# Patient Record
Sex: Female | Born: 2000 | Race: Black or African American | Hispanic: No | Marital: Single | Smoking: Never smoker
Health system: Southern US, Community
[De-identification: ages and names within clinical notes are randomized; demographics above are authoritative.]

## PROBLEM LIST (undated history)

## (undated) DIAGNOSIS — J45909 Unspecified asthma, uncomplicated: Secondary | ICD-10-CM

## (undated) HISTORY — PX: NO PAST SURGERIES: SHX2092

---
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
## 2021-01-06 ENCOUNTER — Other Ambulatory Visit (HOSPITAL_COMMUNITY)
Admission: RE | Admit: 2021-01-06 | Discharge: 2021-01-06 | Disposition: A | Discharge status: Home / Self Care | Payer: BLUE CROSS/BLUE SHIELD | Source: Ambulatory Visit | Attending: Obstetrics and Gynecology | Admitting: Obstetrics and Gynecology

## 2021-01-06 ENCOUNTER — Other Ambulatory Visit: Payer: Self-pay

## 2021-01-06 ENCOUNTER — Ambulatory Visit (INDEPENDENT_AMBULATORY_CARE_PROVIDER_SITE_OTHER): Payer: BLUE CROSS/BLUE SHIELD

## 2021-01-06 VITALS — BP 116/75 | HR 75

## 2021-01-06 DIAGNOSIS — N898 Other specified noninflammatory disorders of vagina: Secondary | ICD-10-CM

## 2021-01-06 DIAGNOSIS — Z113 Encounter for screening for infections with a predominantly sexual mode of transmission: Secondary | ICD-10-CM | POA: Diagnosis not present

## 2021-01-06 NOTE — Progress Notes (Signed)
SUBJECTIVE:  20 y.o. female complains of vaginal discharge for a couple of days.  Denies abnormal vaginal bleeding or significant pelvic pain or fever. No UTI symptoms. Denies history of known exposure to STD.  No LMP recorded. Patient has had an implant.  OBJECTIVE:  She appears well, afebrile. Urine dipstick: not done  ASSESSMENT:  Vaginal Discharge: small amount  Vaginal Odor; small amount    PLAN:  GC, chlamydia, trichomonas, BVAG, CVAG probe sent to lab. Treatment: To be determined once lab results are received ROV prn if symptoms persist or worsen.

## 2021-01-08 LAB — CERVICOVAGINAL ANCILLARY ONLY
Bacterial Vaginitis (gardnerella): NEGATIVE
Candida Glabrata: NEGATIVE
Candida Vaginitis: NEGATIVE
Chlamydia: NEGATIVE
Comment: NEGATIVE
Comment: NEGATIVE
Comment: NEGATIVE
Comment: NEGATIVE
Comment: NEGATIVE
Comment: NORMAL
Neisseria Gonorrhea: NEGATIVE
Trichomonas: NEGATIVE

## 2021-01-12 ENCOUNTER — Ambulatory Visit
Admission: EM | Admit: 2021-01-12 | Discharge: 2021-01-12 | Disposition: A | Discharge status: Home / Self Care | Payer: BLUE CROSS/BLUE SHIELD | Attending: Emergency Medicine | Admitting: Emergency Medicine

## 2021-01-12 ENCOUNTER — Other Ambulatory Visit: Payer: Self-pay

## 2021-01-12 DIAGNOSIS — G43009 Migraine without aura, not intractable, without status migrainosus: Secondary | ICD-10-CM | POA: Diagnosis not present

## 2021-01-12 DIAGNOSIS — G43001 Migraine without aura, not intractable, with status migrainosus: Secondary | ICD-10-CM

## 2021-01-12 MED ORDER — RIZATRIPTAN BENZOATE 5 MG PO TABS: 5.0000 mg | ORAL_TABLET | ORAL | 0 refills | Status: DC | PRN

## 2021-01-12 NOTE — ED Triage Notes (Signed)
Pt c/o migraines x2 days, hx of same. Denies n/v.

## 2021-01-12 NOTE — ED Provider Notes (Signed)
[name]    CSN: [id number] Arrival date & time: 01/12/21  1406      History   Chief Complaint Chief Complaint  Patient presents with  . Migraine    HPI Sharon Mcpherson is a 20 y.o. female history of asthma presenting today for evaluation of a migraine.  Reports history of recurrent migraines.  Current migraine onset 2 days ago.  Reported location in frontal area, history of similar.  Denies any vision changes, some slight light sensitivity.  Denies nausea or vomiting.  Pain slightly eased off from yesterday.  Reports past visit was prescribed Maxalt and helped significantly.  Requesting refill.  Provided Toradol and Decadron at last visit as well, but patient would like to hold off on this and mainly stick to oral medicine.  Denies recent URI symptoms.  Denies fever or neck pain.  HPI  Past Medical History:  Diagnosis Date  . Asthma     Patient Active Problem List   Diagnosis Date Noted  . Migraine without aura and without status migrainosus, not intractable 11/21/2018  . Tension headache 11/21/2018  . Anxiety state 11/21/2018  . Sleeping difficulty 11/21/2018    Past Surgical History:  Procedure Laterality Date  . NO PAST SURGERIES      OB History    Gravida  0   Para  0   Term  0   Preterm  0   AB  0   Living  0     SAB  0   IAB  0   Ectopic  0   Multiple  0   Live Births  0            Home Medications    Prior to Admission medications   Medication Sig Start Date End Date Taking? Authorizing Provider  etonogestrel (NEXPLANON) 68 MG IMPL implant Inject into the skin.    [provider]  rizatriptan (MAXALT) 5 MG tablet Take 1 tablet (5 mg total) by mouth as needed for migraine. May repeat in 2 hours if needed 01/12/21   Ka Bench, [employer] C, PA-C  amitriptyline (ELAVIL) 25 MG tablet Take 1 tablet (25 mg total) by mouth at bedtime. Patient not taking: Reported on 05/24/2019 11/21/18 06/13/19  Keturah Shavers, MD    Family  History Family History  Problem Relation Age of Onset  . Hyperlipidemia Mother   . Healthy Father   . Migraines Neg Hx   . Seizures Neg Hx   . Autism Neg Hx   . ADD / ADHD Neg Hx   . Anxiety disorder Neg Hx   . Depression Neg Hx   . Bipolar disorder Neg Hx   . Schizophrenia Neg Hx     Social History Social History   Tobacco Use  . Smoking status: Never Smoker  . Smokeless tobacco: Never Used  Vaping Use  . Vaping Use: Never used  Substance Use Topics  . Alcohol use: No  . Drug use: Yes    Types: Marijuana     Allergies   Patient has no known allergies.   Review of Systems Review of Systems  Constitutional: Negative for activity change, appetite change, chills, fatigue and fever.  HENT: Negative for congestion, ear pain, rhinorrhea, sinus pressure, sore throat and trouble swallowing.   Eyes: Negative for discharge and redness.  Respiratory: Negative for cough, chest tightness and shortness of breath.   Cardiovascular: Negative for chest pain.  Gastrointestinal: Negative for abdominal pain, diarrhea, nausea and vomiting.  Musculoskeletal: Negative for myalgias.  Skin: Negative for rash.  Neurological: Positive for headaches. Negative for dizziness and light-headedness.     Physical Exam Triage Vital Signs ED Triage Vitals  Enc Vitals Group     BP      Pulse      Resp      Temp      Temp src      SpO2      Weight      Height      Head Circumference      Peak Flow      Pain Score      Pain Loc      Pain Edu?      Excl. in GC?    No data found.  Updated Vital Signs BP 135/83 (BP Location: Right Arm)   Pulse 69   Temp 98.2 F (36.8 C) (Oral)   Resp 16   SpO2 97%   Visual Acuity Right Eye Distance:   Left Eye Distance:   Bilateral Distance:    Right Eye Near:   Left Eye Near:    Bilateral Near:     Physical Exam Vitals and nursing note reviewed.  Constitutional:      Appearance: She is well-developed.     Comments: No acute distress   HENT:     Head: Normocephalic and atraumatic.     Ears:     Comments: Bilateral ears without tenderness to palpation of external auricle, tragus and mastoid, EAC's without erythema or swelling, TM's with good bony landmarks and cone of light. Non erythematous.     Nose: Nose normal.     Mouth/Throat:     Comments: Oral mucosa pink and moist, no tonsillar enlargement or exudate. Posterior pharynx patent and nonerythematous, no uvula deviation or swelling. Normal phonation. Eyes:     Conjunctiva/sclera: Conjunctivae normal.  Cardiovascular:     Rate and Rhythm: Normal rate.  Pulmonary:     Effort: Pulmonary effort is normal. No respiratory distress.     Comments: Breathing comfortably at rest, CTABL, no wheezing, rales or other adventitious sounds auscultated Abdominal:     General: There is no distension.  Musculoskeletal:        General: Normal range of motion.     Cervical back: Neck supple.  Skin:    General: Skin is warm and dry.  Neurological:     General: No focal deficit present.     Mental Status: She is alert and oriented to person, place, and time. Mental status is at baseline.     Cranial Nerves: No cranial nerve deficit.     Motor: No weakness.     Gait: Gait normal.      UC Treatments / Results  Labs (all labs ordered are listed, but only abnormal results are displayed) Labs Reviewed - No data to display  EKG   Radiology No results found.  Procedures Procedures (including critical care time)  Medications Ordered in UC Medications - No data to display  Initial Impression / Assessment and Plan / UC Course  I have reviewed the triage vital signs and the nursing notes.  Pertinent labs & imaging results that were available during my care of the patient were reviewed by me and considered in my medical decision making (see chart for details).     Refilled Maxalt, encourage patient to contact PCP if this has been helping for further refills and treatment  of recurrent migraines.  No neurodeficit on exam today.  Deferring migraine cocktail.  Discussed strict return precautions. Patient verbalized understanding and is agreeable with plan.  Final Clinical Impressions(s) / UC Diagnoses   Final diagnoses:  Migraine without aura and without status migrainosus, not intractable     Discharge Instructions     Maxalt refilled Follow up with primary care Return as needed    ED Prescriptions    Medication Sig Dispense Auth. Provider   rizatriptan (MAXALT) 5 MG tablet Take 1 tablet (5 mg total) by mouth as needed for migraine. May repeat in 2 hours if needed 10 tablet Raelle Chambers, [place] C, PA-C     PDMP not reviewed this encounter.   Lew Dawes, [place] 01/12/21 1604

## 2021-01-12 NOTE — Discharge Instructions (Signed)
Maxalt refilled Follow up with primary care Return as needed

## 2021-03-08 ENCOUNTER — Other Ambulatory Visit: Payer: Self-pay

## 2021-03-08 ENCOUNTER — Ambulatory Visit (HOSPITAL_COMMUNITY)
Admission: EM | Admit: 2021-03-08 | Discharge: 2021-03-08 | Disposition: A | Discharge status: Home / Self Care | Payer: No Payment, Other | Attending: Psychiatry | Admitting: Psychiatry

## 2021-03-08 DIAGNOSIS — F331 Major depressive disorder, recurrent, moderate: Secondary | ICD-10-CM | POA: Insufficient documentation

## 2021-03-08 MED ORDER — HYDROXYZINE PAMOATE 25 MG PO CAPS: 25.0000 mg | ORAL_CAPSULE | Freq: Three times a day (TID) | ORAL | 0 refills | Status: DC | PRN

## 2021-03-08 MED ORDER — SERTRALINE HCL 50 MG PO TABS: 50.0000 mg | ORAL_TABLET | Freq: Every day | ORAL | 0 refills | Status: DC

## 2021-03-08 NOTE — BH Assessment (Signed)
Triage Note- ROUTINE- Depression sx, "not feeling myself for past few months". Pt denies SI, HI and AVH. Has tried 2 antidepressants with her PCP but didn't find they made any difference so stopped taking them. Pt states she is interested in therapy and trying medication for depression again.

## 2021-03-08 NOTE — Progress Notes (Signed)
Sharon Mcpherson received her AVS, questions answered and escorted to retrieve her personal belkongings.

## 2021-03-08 NOTE — ED Provider Notes (Signed)
[facility] Medical Screening Exam  Patient Name: Sharon Mcpherson MRN: [id number] Date of Evaluation: 03/08/21 Chief Complaint:   Diagnosis:  Final diagnoses:  MDD (major depressive disorder), recurrent episode, moderate (HCC)    History of Present illness: Sharon Mcpherson is a 20 y.o. female patient presented to [facility] as a walk in alone with complaints of "I am not feeling quite my self".  Sharon Mcpherson, 20 y.o., female patient seen face to face by this provider, consulted with Dr. Lucianne Muss; and chart reviewed on 03/08/21.    During evaluation Sharon Mcpherson is sitting position in no acute distress.  She is well-groomed and makes good eye contact.  She is alert, oriented x 4, cooperative and attentive.  Her mood is depressed with congruent affect.  Reports that she has been having increased crying spells and little motivation.  States she sleeps 4 to 5 hours per night, and no concerns with appetite.  She has normal speech, and behavior. Reports she is anxious at times. Objectively there is no evidence of psychosis/mania or delusional thinking.  Patient is able to converse coherently, goal directed thoughts, no distractibility, or pre-occupation.  She also denies suicidal/self-harm/homicidal ideation, psychosis, and paranoia.  Patient contracts for safety, denies any access to weapons or firearms.  Patient answered question appropriately.     Patient states that she sees Dr. Delbert Harness, PCP with [facility] health in [place].  States her visits are mainly for her migraines.  States her MD started her on Cymbalta and Effexor in the past to try to help with depression and her migraines.  Reports that she took both medicines for over a month but did not feel like they were effective so she quit taking them.  Patient states that she also has participated in psychiatric therapy in the past.  States she is willing to engage in therapeutic services again.  Patient is requesting medication  for depression and anxiety.  Prescription for Zoloft 50 mg p.o. daily and hydroxyzine 25 mg p.o. TID PRN given to patient.  Education provided on side effects of medications.  Discussed extensively that refills would need to be obtained through her outpatient provider, there will be no refills provided at the [facility].  Psychiatric Specialty Exam  Presentation  General Appearance:Appropriate for Environment; Casual  Eye Contact:Good  Speech:Clear and Coherent; Normal Rate  Speech Volume:Normal  Handedness:Right   Mood and Affect  Mood:Depressed; Anxious  Affect:Congruent   Thought Process  Thought Processes:Coherent  Descriptions of Associations:Intact  Orientation:Full (Time, Place and Person)  Thought Content:Logical    Hallucinations:None  Ideas of Reference:None  Suicidal Thoughts:No  Homicidal Thoughts:No   Sensorium  Memory:Immediate Good; Recent Good; Remote Good  Judgment:Good  Insight:Good   Executive Functions  Concentration:Good  Attention Span:Good  Recall:Good  Fund of Knowledge:Good  Language:Good   Psychomotor Activity  Psychomotor Activity:Normal   Assets  Assets:Communication Skills; Desire for Improvement; Financial Resources/Insurance; Housing; Physical Health; Resilience; Social Support; Transportation; Vocational/Educational   Sleep  Sleep:Fair  Number of hours: 5   No data recorded  Physical Exam: Physical Exam Vitals and nursing note reviewed.  Constitutional:      General: She is not in acute distress.    Appearance: Normal appearance. She is not ill-appearing.  HENT:     Head: Normocephalic.  Eyes:     Pupils: Pupils are equal, round, and reactive to light.  Cardiovascular:     Rate and Rhythm: Normal rate.  Pulmonary:  Effort: Pulmonary effort is normal.  Musculoskeletal:        General: Normal range of motion.     Cervical back: Normal range of motion.  Skin:    General: Skin is warm and dry.   Neurological:     Mental Status: She is alert and oriented to person, place, and time.  Psychiatric:        Attention and Perception: Attention and perception normal.        Mood and Affect: Affect normal. Mood is anxious and depressed.        Speech: Speech normal.        Behavior: Behavior normal.        Thought Content: Thought content normal.        Cognition and Memory: Cognition normal.        Judgment: Judgment normal.   Review of Systems  Constitutional: Negative.   HENT: Negative.    Eyes: Negative.   Respiratory: Negative.    Cardiovascular: Negative.   Musculoskeletal: Negative.   Skin: Negative.   Neurological:  Positive for headaches.  Psychiatric/Behavioral:  Positive for depression. The patient is nervous/anxious.   Blood pressure 135/87, pulse 99, temperature 98.6 F (37 C), temperature source Oral, resp. rate 16, SpO2 99 %. There is no height or weight on file to calculate BMI.  Musculoskeletal: Strength & Muscle Tone: within normal limits Gait & Station: normal Patient leans: N/A   [facility] MSE Discharge Disposition for Follow up and Recommendations: Based on my evaluation the patient does not appear to have an emergency medical condition and can be discharged with resources and follow up care in outpatient services for Medication Management and Individual Therapy   Prescription for Zoloft 50 mg PO QD and hydroxyzine 25 mg p.o. TID PRN  given to patient. Outpatient psychiatric resources provided.   Ardis Hughs, NP 03/08/2021, 7:59 AM

## 2021-03-08 NOTE — Discharge Instructions (Addendum)

## 2021-03-11 ENCOUNTER — Telehealth (HOSPITAL_COMMUNITY): Payer: Self-pay | Admitting: Family Medicine

## 2021-03-11 NOTE — BH Assessment (Signed)
Care Management - Follow Up [facility] Discharges   Writer attempted to make contact with patient today and was unsuccessful.  Writer was able to leave a HIPPA compliant voice message and will await callback.   

## 2021-06-24 DIAGNOSIS — Z309 Encounter for contraceptive management, unspecified: Secondary | ICD-10-CM | POA: Insufficient documentation

## 2021-12-03 ENCOUNTER — Other Ambulatory Visit (HOSPITAL_COMMUNITY)
Admission: RE | Admit: 2021-12-03 | Discharge: 2021-12-03 | Disposition: A | Discharge status: Home / Self Care | Payer: Medicaid Other | Source: Ambulatory Visit | Attending: Obstetrics and Gynecology | Admitting: Obstetrics and Gynecology

## 2021-12-03 ENCOUNTER — Ambulatory Visit (INDEPENDENT_AMBULATORY_CARE_PROVIDER_SITE_OTHER): Payer: Medicaid Other

## 2021-12-03 DIAGNOSIS — R350 Frequency of micturition: Secondary | ICD-10-CM | POA: Diagnosis not present

## 2021-12-03 DIAGNOSIS — N898 Other specified noninflammatory disorders of vagina: Secondary | ICD-10-CM

## 2021-12-03 LAB — POCT URINALYSIS DIPSTICK
Bilirubin, UA: NEGATIVE
Blood, UA: NEGATIVE
Glucose, UA: NEGATIVE
Ketones, UA: NEGATIVE
Leukocytes, UA: NEGATIVE
Nitrite, UA: NEGATIVE
Protein, UA: NEGATIVE
Spec Grav, UA: 1.01 (ref 1.010–1.025)
Urobilinogen, UA: 0.2 E.U./dL
pH, UA: 6.5 (ref 5.0–8.0)

## 2021-12-03 NOTE — Progress Notes (Signed)
..  SUBJECTIVE:  ?21 y.o. female complains of  vaginal discharge and irritation for 2 day(s). ?Denies abnormal vaginal bleeding or significant pelvic pain or ?fever. Reports urinary frequency. Denies history of known exposure to STD. ? ?No LMP recorded. Patient has had an implant. ? ?OBJECTIVE:  ?She appears well, afebrile. ?Urine dipstick: negative for all components. ? ?ASSESSMENT:  ?Vaginal Discharge  ?Denies Vaginal Odor ? ? ?PLAN:  ?GC, chlamydia, trichomonas, BVAG, CVAG probe sent to lab. ?Treatment: To be determined once lab results are received ?Urine culture sent ?ROV prn if symptoms persist or worsen. ? ? ?

## 2021-12-03 NOTE — Progress Notes (Signed)
Patient was assessed and managed by nursing staff during this encounter. I have reviewed the chart and agree with the documentation and plan. I have also made any necessary editorial changes. ? ?Roneka Gilpin A Bobetta Korf, MD ?12/03/2021 11:45 AM   ?

## 2021-12-04 LAB — CERVICOVAGINAL ANCILLARY ONLY
Bacterial Vaginitis (gardnerella): POSITIVE — AB
Candida Glabrata: NEGATIVE
Candida Vaginitis: NEGATIVE
Chlamydia: NEGATIVE
Comment: NEGATIVE
Comment: NEGATIVE
Comment: NEGATIVE
Comment: NEGATIVE
Comment: NEGATIVE
Comment: NORMAL
Neisseria Gonorrhea: NEGATIVE
Trichomonas: NEGATIVE

## 2021-12-05 LAB — CULTURE, OB URINE

## 2021-12-05 LAB — URINE CULTURE, OB REFLEX

## 2021-12-07 ENCOUNTER — Other Ambulatory Visit: Payer: Self-pay

## 2021-12-07 DIAGNOSIS — N760 Acute vaginitis: Secondary | ICD-10-CM

## 2021-12-07 MED ORDER — METRONIDAZOLE 500 MG PO TABS: 500.0000 mg | ORAL_TABLET | Freq: Two times a day (BID) | ORAL | 0 refills | Status: DC

## 2021-12-28 ENCOUNTER — Ambulatory Visit
Admission: EM | Admit: 2021-12-28 | Discharge: 2021-12-28 | Disposition: A | Discharge status: Home / Self Care | Payer: Medicaid Other | Attending: Urgent Care | Admitting: Urgent Care

## 2021-12-28 DIAGNOSIS — H6982 Other specified disorders of Eustachian tube, left ear: Secondary | ICD-10-CM | POA: Diagnosis not present

## 2021-12-28 MED ORDER — LEVOCETIRIZINE DIHYDROCHLORIDE 5 MG PO TABS: 5.0000 mg | ORAL_TABLET | Freq: Every evening | ORAL | 0 refills | Status: DC

## 2021-12-28 MED ORDER — PSEUDOEPHEDRINE HCL 30 MG PO TABS: 30.0000 mg | ORAL_TABLET | Freq: Three times a day (TID) | ORAL | 0 refills | Status: DC | PRN

## 2021-12-28 MED ORDER — FLUTICASONE PROPIONATE 50 MCG/ACT NA SUSP: 2.0000 | Freq: Every day | NASAL | 12 refills | Status: DC

## 2021-12-28 NOTE — ED Triage Notes (Signed)
Patient presents to Urgent Care with complaints of ear ringing since this morning in her L ear.  ? ?

## 2021-12-28 NOTE — ED Provider Notes (Signed)
?[facility] ? ? ?MRN: [id number] DOB: 03-19-2001 ? ?Subjective:  ? ?Sharon Mcpherson is a 21 y.o. female presenting for 2 to 3-day history of acute onset intermittent left ear ringing.  No fever, drainage, dizziness, runny or stuffy nose, sore throat, cough. ? ?No current facility-administered medications for this encounter. ? ?Current Outpatient Medications:  ?  etonogestrel (NEXPLANON) 68 MG IMPL implant, Inject into the skin., Disp: , Rfl:  ?  hydrOXYzine (VISTARIL) 25 MG capsule, Take 1 capsule (25 mg total) by mouth 3 (three) times daily as needed for anxiety. (Patient not taking: Reported on 12/03/2021), Disp: 30 capsule, Rfl: 0 ?  metroNIDAZOLE (FLAGYL) 500 MG tablet, Take 1 tablet (500 mg total) by mouth 2 (two) times daily., Disp: 14 tablet, Rfl: 0 ?  rizatriptan (MAXALT) 5 MG tablet, Take 1 tablet (5 mg total) by mouth as needed for migraine. May repeat in 2 hours if needed (Patient not taking: Reported on 12/03/2021), Disp: 10 tablet, Rfl: 0 ?  sertraline (ZOLOFT) 50 MG tablet, Take 1 tablet (50 mg total) by mouth daily. (Patient not taking: Reported on 12/03/2021), Disp: 30 tablet, Rfl: 0  ? ?No Known Allergies ? ?Past Medical History:  ?Diagnosis Date  ? Asthma   ?  ? ?Past Surgical History:  ?Procedure Laterality Date  ? NO PAST SURGERIES    ? ? ?Family History  ?Problem Relation Age of Onset  ? Hyperlipidemia Mother   ? Healthy Father   ? Migraines Neg Hx   ? Seizures Neg Hx   ? Autism Neg Hx   ? ADD / ADHD Neg Hx   ? Anxiety disorder Neg Hx   ? Depression Neg Hx   ? Bipolar disorder Neg Hx   ? Schizophrenia Neg Hx   ? ? ?Social History  ? ?Tobacco Use  ? Smoking status: Never  ? Smokeless tobacco: Never  ?Vaping Use  ? Vaping Use: Never used  ?Substance Use Topics  ? Alcohol use: No  ? Drug use: Yes  ?  Frequency: 2.0 times per week  ?  Types: Marijuana  ?  Comment: weekly  ? ? ?ROS ? ? ?Objective:  ? ?Vitals: ?BP 121/82 (BP Location: Right Arm)   Pulse 81   Temp 98 ?F (36.7 ?C) (Oral)    Resp 18   SpO2 96%  ? ?Physical Exam ?Constitutional:   ?   General: She is not in acute distress. ?   Appearance: Normal appearance. She is well-developed. She is not ill-appearing, toxic-appearing or diaphoretic.  ?HENT:  ?   Head: Normocephalic and atraumatic.  ?   Right Ear: Tympanic membrane, ear canal and external ear normal. No tenderness. There is no impacted cerumen. Tympanic membrane is not injected, perforated, erythematous or bulging.  ?   Left Ear: Tympanic membrane, ear canal and external ear normal. No tenderness. There is no impacted cerumen. Tympanic membrane is not injected, perforated, erythematous or bulging.  ?   Nose: Nose normal.  ?   Mouth/Throat:  ?   Mouth: Mucous membranes are moist.  ?Eyes:  ?   General: No scleral icterus.    ?   Right eye: No discharge.     ?   Left eye: No discharge.  ?   Extraocular Movements: Extraocular movements intact.  ?Cardiovascular:  ?   Rate and Rhythm: Normal rate.  ?Pulmonary:  ?   Effort: Pulmonary effort is normal.  ?Skin: ?   General: Skin is warm and dry.  ?Neurological:  ?  General: No focal deficit present.  ?   Mental Status: She is alert and oriented to person, place, and time.  ?Psychiatric:     ?   Mood and Affect: Mood normal.     ?   Behavior: Behavior normal.  ? ? ?Assessment and Plan :  ? ?PDMP not reviewed this encounter. ? ?1. Eustachian tube dysfunction, left   ? ?Unremarkable ENT exam.  Will use conservative management for what I suspect is eustachian tube dysfunction.  Recommended starting Flonase, Zyrtec, Sudafed. Counseled patient on potential for adverse effects with medications prescribed/recommended today, ER and return-to-clinic precautions discussed, patient verbalized understanding. ? ?  ?Wallis Bamberg, PA-C ?12/29/21 1660 ? ?

## 2022-01-12 ENCOUNTER — Ambulatory Visit (INDEPENDENT_AMBULATORY_CARE_PROVIDER_SITE_OTHER): Payer: Medicaid Other

## 2022-01-12 ENCOUNTER — Other Ambulatory Visit (HOSPITAL_COMMUNITY)
Admission: RE | Admit: 2022-01-12 | Discharge: 2022-01-12 | Disposition: A | Discharge status: Home / Self Care | Payer: Medicaid Other | Source: Ambulatory Visit | Attending: Obstetrics and Gynecology | Admitting: Obstetrics and Gynecology

## 2022-01-12 VITALS — BP 122/82 | HR 79 | Ht 60.0 in | Wt 132.0 lb

## 2022-01-12 DIAGNOSIS — N898 Other specified noninflammatory disorders of vagina: Secondary | ICD-10-CM | POA: Diagnosis present

## 2022-01-12 NOTE — Progress Notes (Signed)
SUBJECTIVE:  ?21 y.o. female complains of green and malodorous vaginal discharge and itching for 2 week(s). ?Denies abnormal vaginal bleeding or significant pelvic pain or ?fever. No UTI symptoms. Denies history of known exposure to STD. ? ?No LMP recorded. Patient has had an implant. ? ?OBJECTIVE:  ?She appears well, afebrile. ?Urine dipstick: not done. ? ?ASSESSMENT:  ?Vaginal Discharge  ?Vaginal Odor, Itching ? ? ?PLAN:  ?GC, chlamydia, trichomonas, BVAG, CVAG probe sent to lab. ?Treatment: To be determined once lab results are received ?ROV prn if symptoms persist or worsen.  ?

## 2022-01-12 NOTE — Progress Notes (Signed)
Patient was assessed and managed by nursing staff during this encounter. I have reviewed the chart and agree with the documentation and plan. I have also made any necessary editorial changes. ? ?Kadian Barcellos, MD ?01/12/2022 2:01 PM  ? ?

## 2022-01-13 ENCOUNTER — Encounter: Payer: Self-pay | Admitting: Emergency Medicine

## 2022-01-13 ENCOUNTER — Ambulatory Visit
Admission: EM | Admit: 2022-01-13 | Discharge: 2022-01-13 | Disposition: A | Discharge status: Home / Self Care | Payer: Medicaid Other | Attending: Physician Assistant | Admitting: Physician Assistant

## 2022-01-13 ENCOUNTER — Ambulatory Visit (INDEPENDENT_AMBULATORY_CARE_PROVIDER_SITE_OTHER): Payer: Medicaid Other

## 2022-01-13 DIAGNOSIS — M542 Cervicalgia: Secondary | ICD-10-CM

## 2022-01-13 DIAGNOSIS — M79605 Pain in left leg: Secondary | ICD-10-CM

## 2022-01-13 LAB — CERVICOVAGINAL ANCILLARY ONLY
Bacterial Vaginitis (gardnerella): POSITIVE — AB
Candida Glabrata: NEGATIVE
Candida Vaginitis: NEGATIVE
Chlamydia: NEGATIVE
Comment: NEGATIVE
Comment: NEGATIVE
Comment: NEGATIVE
Comment: NEGATIVE
Comment: NEGATIVE
Comment: NORMAL
Neisseria Gonorrhea: NEGATIVE
Trichomonas: NEGATIVE

## 2022-01-13 MED ORDER — NAPROXEN 375 MG PO TABS: 375.0000 mg | ORAL_TABLET | Freq: Two times a day (BID) | ORAL | 0 refills | Status: DC

## 2022-01-13 MED ORDER — METHOCARBAMOL 500 MG PO TABS: 500.0000 mg | ORAL_TABLET | Freq: Two times a day (BID) | ORAL | 0 refills | Status: DC

## 2022-01-13 NOTE — Discharge Instructions (Signed)
Your x-ray was normal.  I believe that you have a bone bruise which is causing your symptoms.  Start Naprosyn twice daily.  Do not take NSAIDs including aspirin, ibuprofen/Advil, naproxen/Aleve as it will cause stomach bleeding.  You can use Tylenol for breakthrough pain.  Keep your leg elevated and use ice for additional symptom relief.  I have called in a muscle relaxer that should help with your pain.  This will make you sleepy so do not drive or drink alcohol while taking it.  You may benefit from seeing a sports medicine specialist to arrange physical therapy; please call to schedule an appointment..  If anything worsens and you have severe pain you should be seen immediately. ?
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
## 2022-01-14 MED ORDER — METRONIDAZOLE 500 MG PO TABS: 500.0000 mg | ORAL_TABLET | Freq: Two times a day (BID) | ORAL | 0 refills | Status: DC

## 2022-01-14 NOTE — Addendum Note (Signed)
Addended by: Catalina Antigua on: 01/14/2022 09:43 AM ? ? Modules accepted: Orders ? ?

## 2022-03-11 ENCOUNTER — Ambulatory Visit: Payer: Medicaid Other

## 2022-03-15 ENCOUNTER — Other Ambulatory Visit (HOSPITAL_COMMUNITY)
Admission: RE | Admit: 2022-03-15 | Discharge: 2022-03-15 | Disposition: A | Discharge status: Home / Self Care | Payer: Medicaid Other | Source: Ambulatory Visit | Attending: Obstetrics & Gynecology | Admitting: Obstetrics & Gynecology

## 2022-03-15 ENCOUNTER — Ambulatory Visit (INDEPENDENT_AMBULATORY_CARE_PROVIDER_SITE_OTHER): Payer: Medicaid Other

## 2022-03-15 VITALS — BP 125/76 | HR 76 | Ht 60.0 in | Wt 127.0 lb

## 2022-03-15 DIAGNOSIS — N898 Other specified noninflammatory disorders of vagina: Secondary | ICD-10-CM

## 2022-03-15 DIAGNOSIS — R350 Frequency of micturition: Secondary | ICD-10-CM

## 2022-03-15 LAB — POCT URINALYSIS DIPSTICK
Bilirubin, UA: NEGATIVE
Glucose, UA: NEGATIVE
Nitrite, UA: NEGATIVE
Protein, UA: POSITIVE — AB
Spec Grav, UA: 1.02 (ref 1.010–1.025)
Urobilinogen, UA: 0.2 E.U./dL
pH, UA: 5 (ref 5.0–8.0)

## 2022-03-15 NOTE — Progress Notes (Signed)
SUBJECTIVE:  21 y.o. female complains of white vaginal discharge for 5 day(s). Denies abnormal vaginal bleeding or significant pelvic pain or fever. Denies history of known exposure to STD.  No LMP recorded. Patient has had an implant.  OBJECTIVE:  She appears well, afebrile. Urine dipstick: positive for protein, positive for leukocytes, and positive for ketones.  ASSESSMENT:  Vaginal Discharge  Vaginal Odor Urinary Frequency   PLAN:  GC, chlamydia, trichomonas, BVAG, CVAG probe and Urine Culture sent to lab.  Treatment: To be determined once lab results are received ROV prn if symptoms persist or worsen.

## 2022-03-16 LAB — CERVICOVAGINAL ANCILLARY ONLY
Bacterial Vaginitis (gardnerella): NEGATIVE
Candida Glabrata: NEGATIVE
Candida Vaginitis: NEGATIVE
Chlamydia: NEGATIVE
Comment: NEGATIVE
Comment: NEGATIVE
Comment: NEGATIVE
Comment: NEGATIVE
Comment: NEGATIVE
Comment: NORMAL
Neisseria Gonorrhea: NEGATIVE
Trichomonas: NEGATIVE

## 2022-03-17 LAB — URINE CULTURE

## 2022-03-22 ENCOUNTER — Other Ambulatory Visit: Payer: Self-pay | Admitting: *Deleted

## 2022-03-22 DIAGNOSIS — N3000 Acute cystitis without hematuria: Secondary | ICD-10-CM

## 2022-03-22 MED ORDER — AMOXICILLIN-POT CLAVULANATE 875-125 MG PO TABS: 1.0000 | ORAL_TABLET | Freq: Two times a day (BID) | ORAL | 0 refills | Status: DC

## 2022-03-22 NOTE — Progress Notes (Signed)
Positive urine culture. TC from pt requesting RX. Symptomatic. Consulted with Dr. Macon Large. V.O. for Augmentin RX. RX sent per orders. MyChart education sent to patient.

## 2022-03-30 NOTE — Progress Notes (Signed)
Patient was assessed and managed by nursing staff during this encounter. I have reviewed the chart and agree with the documentation and plan. I have also made any necessary editorial changes.  Dusan Lipford, MD 03/30/2022 3:00 PM   

## 2022-04-26 ENCOUNTER — Other Ambulatory Visit (HOSPITAL_COMMUNITY)
Admission: RE | Admit: 2022-04-26 | Discharge: 2022-04-26 | Disposition: A | Discharge status: Home / Self Care | Payer: Medicaid Other | Source: Ambulatory Visit | Attending: Obstetrics and Gynecology | Admitting: Obstetrics and Gynecology

## 2022-04-26 ENCOUNTER — Ambulatory Visit: Payer: Medicaid Other

## 2022-04-26 ENCOUNTER — Ambulatory Visit (INDEPENDENT_AMBULATORY_CARE_PROVIDER_SITE_OTHER): Payer: Medicaid Other

## 2022-04-26 VITALS — BP 126/83 | HR 76 | Ht 60.0 in | Wt 125.8 lb

## 2022-04-26 DIAGNOSIS — R300 Dysuria: Secondary | ICD-10-CM | POA: Diagnosis not present

## 2022-04-26 DIAGNOSIS — N898 Other specified noninflammatory disorders of vagina: Secondary | ICD-10-CM | POA: Diagnosis present

## 2022-04-26 LAB — POCT URINALYSIS DIPSTICK
Bilirubin, UA: POSITIVE
Blood, UA: POSITIVE
Glucose, UA: NEGATIVE
Ketones, UA: NEGATIVE
Nitrite, UA: NEGATIVE
Protein, UA: POSITIVE — AB
Spec Grav, UA: 1.01 (ref 1.010–1.025)
Urobilinogen, UA: 0.2 E.U./dL
pH, UA: 6 (ref 5.0–8.0)

## 2022-04-26 LAB — POCT URINE PREGNANCY: Preg Test, Ur: NEGATIVE

## 2022-04-26 NOTE — Progress Notes (Signed)
SUBJECTIVE: Sharon Mcpherson is a 21 y.o. female who complains of dysuria x 1 week, without flank pain, fever, chills, or abnormal vaginal discharge or bleeding.   OBJECTIVE: Appears well, in no apparent distress.  Vital signs are normal. Urine dipstick shows positive for protein, positive for leukocytes, and positive for urobilinogen.    ASSESSMENT: Dysuria  PLAN: Treatment per orders.  Call or return to clinic prn if these symptoms worsen or fail to improve as anticipated.

## 2022-04-26 NOTE — Progress Notes (Signed)
SUBJECTIVE:  21 y.o. female who desires a STI screen. Having clear vaginal discharge with odor for approx 1 week. Denies history of known exposure to STD.  No LMP recorded. Patient has had an implant.  OBJECTIVE:  She appears well.   ASSESSMENT:  STI Screen  POCT  UPT NEG  PLAN:  Pt offered STI blood screening-not indicated GC, chlamydia, and trichomonas probe sent to lab.  Treatment: To be determined once lab results are received.  Pt follow up as needed.

## 2022-04-27 LAB — CERVICOVAGINAL ANCILLARY ONLY
Bacterial Vaginitis (gardnerella): NEGATIVE
Candida Glabrata: NEGATIVE
Candida Vaginitis: POSITIVE — AB
Chlamydia: NEGATIVE
Comment: NEGATIVE
Comment: NEGATIVE
Comment: NEGATIVE
Comment: NEGATIVE
Comment: NEGATIVE
Comment: NORMAL
Neisseria Gonorrhea: NEGATIVE
Trichomonas: NEGATIVE

## 2022-04-27 MED ORDER — FLUCONAZOLE 150 MG PO TABS: 150.0000 mg | ORAL_TABLET | Freq: Once | ORAL | 0 refills | Status: AC

## 2022-04-27 NOTE — Addendum Note (Signed)
Addended by: Catalina Antigua on: 04/27/2022 01:24 PM   Modules accepted: Orders

## 2022-04-29 LAB — URINE CULTURE

## 2022-05-07 ENCOUNTER — Other Ambulatory Visit: Payer: Self-pay | Admitting: Obstetrics & Gynecology

## 2022-05-07 DIAGNOSIS — N3000 Acute cystitis without hematuria: Secondary | ICD-10-CM

## 2022-06-10 ENCOUNTER — Ambulatory Visit (INDEPENDENT_AMBULATORY_CARE_PROVIDER_SITE_OTHER): Payer: Medicaid Other | Admitting: General Practice

## 2022-06-10 ENCOUNTER — Other Ambulatory Visit (HOSPITAL_COMMUNITY)
Admission: RE | Admit: 2022-06-10 | Discharge: 2022-06-10 | Disposition: A | Discharge status: Home / Self Care | Payer: Medicaid Other | Source: Ambulatory Visit | Attending: Obstetrics and Gynecology | Admitting: Obstetrics and Gynecology

## 2022-06-10 VITALS — BP 134/83 | HR 73 | Ht 60.0 in | Wt 131.4 lb

## 2022-06-10 DIAGNOSIS — N898 Other specified noninflammatory disorders of vagina: Secondary | ICD-10-CM

## 2022-06-10 LAB — POCT URINALYSIS DIPSTICK
Bilirubin, UA: NEGATIVE
Blood, UA: POSITIVE
Glucose, UA: NEGATIVE
Ketones, UA: NEGATIVE
Leukocytes, UA: NEGATIVE
Nitrite, UA: NEGATIVE
Protein, UA: NEGATIVE
Spec Grav, UA: 1.01 (ref 1.010–1.025)
Urobilinogen, UA: 1 E.U./dL
pH, UA: 7 (ref 5.0–8.0)

## 2022-06-10 MED ORDER — NITROFURANTOIN MONOHYD MACRO 100 MG PO CAPS: 100.0000 mg | ORAL_CAPSULE | Freq: Two times a day (BID) | ORAL | 0 refills | Status: DC

## 2022-06-10 NOTE — Progress Notes (Signed)
SUBJECTIVE:  21 y.o. female complains of white vaginal discharge and foul smelling urine for 1 month(s). Denies abnormal vaginal bleeding or significant pelvic pain or fever. No UTI symptoms. Denies history of known exposure to STD.  No LMP recorded. Patient has had an implant.  OBJECTIVE:  She appears well, afebrile. Urine dipstick: positive for RBC's.  ASSESSMENT:  Vaginal Discharge  Vaginal Odor   PLAN:  GC, chlamydia, trichomonas, BVAG, CVAG probe sent to lab. Macrobid sent to pharmacy per protocol. Further to be determined once lab results are received ROV prn if symptoms persist or worsen.

## 2022-06-13 LAB — URINE CULTURE

## 2022-06-14 LAB — CERVICOVAGINAL ANCILLARY ONLY
Bacterial Vaginitis (gardnerella): NEGATIVE
Candida Glabrata: NEGATIVE
Candida Vaginitis: POSITIVE — AB
Chlamydia: NEGATIVE
Comment: NEGATIVE
Comment: NEGATIVE
Comment: NEGATIVE
Comment: NEGATIVE
Comment: NEGATIVE
Comment: NORMAL
Neisseria Gonorrhea: NEGATIVE
Trichomonas: NEGATIVE

## 2022-06-14 MED ORDER — FLUCONAZOLE 150 MG PO TABS: 150.0000 mg | ORAL_TABLET | Freq: Once | ORAL | 0 refills | Status: AC

## 2022-06-14 NOTE — Addendum Note (Signed)
Addended by: Mora Bellman on: 06/14/2022 08:39 AM   Modules accepted: Level of Service

## 2022-06-14 NOTE — Addendum Note (Signed)
Addended by: Mora Bellman on: 06/14/2022 05:02 PM   Modules accepted: Orders

## 2022-06-16 ENCOUNTER — Encounter: Payer: Self-pay | Admitting: Obstetrics

## 2022-06-16 ENCOUNTER — Ambulatory Visit (INDEPENDENT_AMBULATORY_CARE_PROVIDER_SITE_OTHER): Payer: Medicaid Other | Admitting: Obstetrics

## 2022-06-16 VITALS — BP 130/91 | HR 63 | Ht 59.5 in | Wt 129.7 lb

## 2022-06-16 DIAGNOSIS — Z3009 Encounter for other general counseling and advice on contraception: Secondary | ICD-10-CM

## 2022-06-16 DIAGNOSIS — Z3046 Encounter for surveillance of implantable subdermal contraceptive: Secondary | ICD-10-CM

## 2022-06-16 DIAGNOSIS — Z30011 Encounter for initial prescription of contraceptive pills: Secondary | ICD-10-CM

## 2022-06-16 MED ORDER — NORETHIN ACE-ETH ESTRAD-FE 1-20 MG-MCG(24) PO TABS: 1.0000 | ORAL_TABLET | Freq: Every day | ORAL | 11 refills | Status: DC

## 2022-06-16 NOTE — Progress Notes (Signed)
Pt is in the office for nexpanon removal, inserted 06/11/2019. Pt wants to start [facility] pills.

## 2022-06-16 NOTE — Progress Notes (Signed)
[facility] REMOVAL NOTE  Date of LMP:   unknown  Contraception used: *Nexplanon   Indications:  The patient desires removal of Nexplanon because of expiration   She wants to start OCP's. She understands risks, benefits, and alternatives to Implanon and would like to proceed.  Anesthesia:   Lidocaine 1% plain.  Procedure:  A time-out was performed confirming the procedure and the patient's allergy status.  Complications: None                      The rod was palpated and the area was sterilely prepped.  The area beneath the distal tip was anesthetized with 1% xylocaine and the skin incised                       Over the tip and the tip was exposed, grasped with forcep and removed intact.  A single suture of 4-0 Vicryl was used to close incision.  Steri strip                       And a bandage applied and the arm was wrapped with gauze bandage.  The patient tolerated well.  Instructions:  The patient was instructed to remove the dressing in 24 hours and that some bruising is to be expected.  She was advised to use over the counter analgesics as needed for any pain at the site.  She is to keep the area dry for 24 hours and to call if her hand or arm becomes cold, numb, or blue.  Return visit:  Return in 2 weeks   I have spent a total of 20 minutes of face-to-face and non-face-to-face time, excluding clinical staff time, reviewing notes and preparing to see patient, ordering tests and/or medications, and counseling the patient.   Shelly Bombard, MD, [place] Attending [place], [facility] for [facility], [place] , [place] 06-16-22

## 2022-07-07 ENCOUNTER — Ambulatory Visit (INDEPENDENT_AMBULATORY_CARE_PROVIDER_SITE_OTHER): Payer: Medicaid Other | Admitting: Obstetrics

## 2022-07-07 ENCOUNTER — Encounter: Payer: Self-pay | Admitting: Obstetrics

## 2022-07-07 VITALS — BP 128/91 | HR 87 | Ht 59.5 in | Wt 134.0 lb

## 2022-07-07 DIAGNOSIS — N3000 Acute cystitis without hematuria: Secondary | ICD-10-CM

## 2022-07-07 DIAGNOSIS — T8149XA Infection following a procedure, other surgical site, initial encounter: Secondary | ICD-10-CM | POA: Diagnosis not present

## 2022-07-07 MED ORDER — DOXYCYCLINE HYCLATE 100 MG PO CAPS: 100.0000 mg | ORAL_CAPSULE | Freq: Two times a day (BID) | ORAL | 0 refills | Status: DC

## 2022-07-07 MED ORDER — METRONIDAZOLE 500 MG PO TABS: 500.0000 mg | ORAL_TABLET | Freq: Two times a day (BID) | ORAL | 2 refills | Status: DC

## 2022-07-07 NOTE — Progress Notes (Signed)
Patient ID: Sharon Mcpherson, female   DOB: October 01, 2000, 21 y.o.   MRN: [id number]  Chief Complaint  Patient presents with   Follow-up    Nexplanon site check    HPI Sharon Mcpherson is a 21 y.o. female.  Complains of pain at Nexplanon Removal incision.  Denies fever/chills.  She also noted pus from the incision. HPI  Past Medical History:  Diagnosis Date   Asthma     Past Surgical History:  Procedure Laterality Date   NO PAST SURGERIES      Family History  Problem Relation Age of Onset   Hyperlipidemia Mother    Healthy Father    Migraines Neg Hx    Seizures Neg Hx    Autism Neg Hx    ADD / ADHD Neg Hx    Anxiety disorder Neg Hx    Depression Neg Hx    Bipolar disorder Neg Hx    Schizophrenia Neg Hx     Social History Social History   Tobacco Use   Smoking status: Never   Smokeless tobacco: Never  Vaping Use   Vaping Use: Never used  Substance Use Topics   Alcohol use: Yes   Drug use: Yes    Frequency: 2.0 times per week    Types: Marijuana    Comment: weekly    No Known Allergies  Current Outpatient Medications  Medication Sig Dispense Refill   doxycycline (VIBRAMYCIN) 100 MG capsule Take 1 capsule (100 mg total) by mouth 2 (two) times daily. 14 capsule 0   metroNIDAZOLE (FLAGYL) 500 MG tablet Take 1 tablet (500 mg total) by mouth 2 (two) times daily. 14 tablet 2   fluticasone (FLONASE) 50 MCG/ACT nasal spray Place 2 sprays into both nostrils daily. (Patient not taking: Reported on 04/26/2022) 16 g 12   hydrOXYzine (VISTARIL) 25 MG capsule Take 1 capsule (25 mg total) by mouth 3 (three) times daily as needed for anxiety. (Patient not taking: Reported on 12/03/2021) 30 capsule 0   levocetirizine (XYZAL) 5 MG tablet Take 1 tablet (5 mg total) by mouth every evening. (Patient not taking: Reported on 04/26/2022) 90 tablet 0   methocarbamol (ROBAXIN) 500 MG tablet Take 1 tablet (500 mg total) by mouth 2 (two) times daily. (Patient not taking: Reported on 04/26/2022)  20 tablet 0   metroNIDAZOLE (FLAGYL) 500 MG tablet Take 1 tablet (500 mg total) by mouth 2 (two) times daily. (Patient not taking: Reported on 04/26/2022) 14 tablet 0   naproxen (NAPROSYN) 375 MG tablet Take 1 tablet (375 mg total) by mouth 2 (two) times daily. (Patient not taking: Reported on 06/10/2022) 20 tablet 0   Norethindrone Acetate-Ethinyl Estrad-FE (LOESTRIN 24 FE) 1-20 MG-MCG(24) tablet Take 1 tablet by mouth daily. 28 tablet 11   rizatriptan (MAXALT) 5 MG tablet Take 1 tablet (5 mg total) by mouth as needed for migraine. May repeat in 2 hours if needed (Patient not taking: Reported on 12/03/2021) 10 tablet 0   sertraline (ZOLOFT) 50 MG tablet Take 1 tablet (50 mg total) by mouth daily. (Patient not taking: Reported on 12/03/2021) 30 tablet 0   No current facility-administered medications for this visit.    Review of Systems Review of Systems Constitutional: negative for fatigue and weight loss Respiratory: negative for cough and wheezing Cardiovascular: negative for chest pain, fatigue and palpitations Gastrointestinal: negative for abdominal pain and change in bowel habits Genitourinary:negative Integument/breast: positive for pain and pus in Nexplanon Removal incision Musculoskeletal:negative for myalgias Neurological: negative for gait  problems and tremors Behavioral/Psych: negative for abusive relationship, depression Endocrine: negative for temperature intolerance      Blood pressure (!) 128/91, pulse 87, height 4' 11.5" (1.511 m), weight 134 lb (60.8 kg).  Physical Exam Physical Exam General:   Alert and no distress  Skin:   Left Upper Extremity:  incision is tender with small amount of pus  Lungs:   clear to auscultation bilaterally  Heart:   regular rate and rhythm, S1, S2 normal, no murmur, click, rub or gallop    I have spent a total of 20 minutes of face-to-face time, excluding clinical staff time, reviewing notes and preparing to see patient, ordering tests and/or  medications, and counseling the patient.   Data Reviewed Labs  Assessment     1. Wound infection after surgery Rx: - doxycycline (VIBRAMYCIN) 100 MG capsule; Take 1 capsule (100 mg total) by mouth 2 (two) times daily.  Dispense: 14 capsule; Refill: 0 - metroNIDAZOLE (FLAGYL) 500 MG tablet; Take 1 tablet (500 mg total) by mouth 2 (two) times daily.  Dispense: 14 tablet; Refill: 2  2. Acute cystitis with hematuria, treated - TOC cultures today Rx: - Urine Culture     Plan  Follow up in 2 weeks    Shelly Bombard, MD 07/07/2022 11:17 AM

## 2022-07-07 NOTE — Progress Notes (Signed)
21 y.o GYN presents for Nexplanon site check.  C/o pus, redness, pain 5/10.

## 2022-07-10 LAB — URINE CULTURE

## 2022-08-24 ENCOUNTER — Other Ambulatory Visit (HOSPITAL_COMMUNITY)
Admission: RE | Admit: 2022-08-24 | Discharge: 2022-08-24 | Disposition: A | Discharge status: Home / Self Care | Payer: Medicaid Other | Source: Ambulatory Visit | Attending: Obstetrics and Gynecology | Admitting: Obstetrics and Gynecology

## 2022-08-24 ENCOUNTER — Ambulatory Visit (INDEPENDENT_AMBULATORY_CARE_PROVIDER_SITE_OTHER): Payer: Medicaid Other | Admitting: *Deleted

## 2022-08-24 VITALS — BP 126/84 | HR 64

## 2022-08-24 DIAGNOSIS — N898 Other specified noninflammatory disorders of vagina: Secondary | ICD-10-CM

## 2022-08-24 NOTE — Progress Notes (Signed)
SUBJECTIVE:  21 y.o. female complains of white vaginal discharge for 1 week(s). Denies abnormal vaginal bleeding or significant pelvic pain or fever. No UTI symptoms. Denies history of known exposure to STD.  No LMP recorded. Patient has had an implant.  OBJECTIVE:  She appears well, afebrile. Urine dipstick: not done.  ASSESSMENT:  Vaginal Discharge  Vaginal Odor   PLAN:  GC, chlamydia, trichomonas, BVAG, CVAG probe sent to lab. Treatment: To be determined once lab results are received ROV prn if symptoms persist or worsen.

## 2022-08-25 LAB — CERVICOVAGINAL ANCILLARY ONLY
Bacterial Vaginitis (gardnerella): NEGATIVE
Candida Glabrata: NEGATIVE
Candida Vaginitis: POSITIVE — AB
Chlamydia: NEGATIVE
Comment: NEGATIVE
Comment: NEGATIVE
Comment: NEGATIVE
Comment: NEGATIVE
Comment: NEGATIVE
Comment: NORMAL
Neisseria Gonorrhea: NEGATIVE
Trichomonas: NEGATIVE

## 2022-08-26 ENCOUNTER — Encounter: Payer: Self-pay | Admitting: Emergency Medicine

## 2022-08-26 ENCOUNTER — Other Ambulatory Visit: Payer: Self-pay | Admitting: Emergency Medicine

## 2022-08-26 DIAGNOSIS — B3731 Acute candidiasis of vulva and vagina: Secondary | ICD-10-CM

## 2022-08-26 MED ORDER — FLUCONAZOLE 150 MG PO TABS: 150.0000 mg | ORAL_TABLET | Freq: Once | ORAL | 0 refills | Status: AC

## 2022-08-26 NOTE — Progress Notes (Signed)
Attempted TC to patient to discuss results. Unable to leave voice message. Rx sent to pharmacy on file. MyChart message sent.   Rx for vaginal yeast.

## 2022-09-23 ENCOUNTER — Other Ambulatory Visit (HOSPITAL_COMMUNITY)
Admission: RE | Admit: 2022-09-23 | Discharge: 2022-09-23 | Disposition: A | Discharge status: Home / Self Care | Payer: Medicaid Other | Source: Ambulatory Visit | Attending: Obstetrics and Gynecology | Admitting: Obstetrics and Gynecology

## 2022-09-23 ENCOUNTER — Ambulatory Visit: Payer: Medicaid Other | Admitting: Emergency Medicine

## 2022-09-23 VITALS — BP 125/81 | HR 84 | Ht 59.0 in | Wt 133.3 lb

## 2022-09-23 DIAGNOSIS — N898 Other specified noninflammatory disorders of vagina: Secondary | ICD-10-CM | POA: Insufficient documentation

## 2022-09-23 NOTE — Progress Notes (Signed)
SUBJECTIVE:  22 y.o. female complains of foul vaginal discharge for 3 day(s). Denies abnormal vaginal bleeding or significant pelvic pain or fever. No UTI symptoms. Denies history of known exposure to STD.  Patient's last menstrual period was 08/23/2022.  OBJECTIVE:  She appears well, afebrile.   ASSESSMENT:  Vaginal Odor   PLAN:  GC, chlamydia, trichomonas, BVAG, CVAG probe sent to lab. Treatment: To be determined once lab results are received ROV prn if symptoms persist or worsen.

## 2022-09-24 LAB — CERVICOVAGINAL ANCILLARY ONLY
Bacterial Vaginitis (gardnerella): POSITIVE — AB
Candida Glabrata: NEGATIVE
Candida Vaginitis: NEGATIVE
Chlamydia: NEGATIVE
Comment: NEGATIVE
Comment: NEGATIVE
Comment: NEGATIVE
Comment: NEGATIVE
Comment: NEGATIVE
Comment: NORMAL
Neisseria Gonorrhea: NEGATIVE
Trichomonas: NEGATIVE

## 2022-09-27 MED ORDER — METRONIDAZOLE 500 MG PO TABS: 500.0000 mg | ORAL_TABLET | Freq: Two times a day (BID) | ORAL | 0 refills | Status: DC

## 2022-09-27 NOTE — Addendum Note (Signed)
Addended by: Mora Bellman on: 09/27/2022 11:02 AM   Modules accepted: Orders

## 2022-11-05 ENCOUNTER — Other Ambulatory Visit (HOSPITAL_COMMUNITY)
Admission: RE | Admit: 2022-11-05 | Discharge: 2022-11-05 | Disposition: A | Discharge status: Home / Self Care | Payer: Medicaid Other | Source: Ambulatory Visit | Attending: Obstetrics and Gynecology | Admitting: Obstetrics and Gynecology

## 2022-11-05 ENCOUNTER — Ambulatory Visit (INDEPENDENT_AMBULATORY_CARE_PROVIDER_SITE_OTHER): Payer: Medicaid Other

## 2022-11-05 DIAGNOSIS — N898 Other specified noninflammatory disorders of vagina: Secondary | ICD-10-CM | POA: Diagnosis not present

## 2022-11-05 DIAGNOSIS — Z1339 Encounter for screening examination for other mental health and behavioral disorders: Secondary | ICD-10-CM

## 2022-11-05 MED ORDER — FLUCONAZOLE 150 MG PO TABS: 150.0000 mg | ORAL_TABLET | Freq: Once | ORAL | 0 refills | Status: AC

## 2022-11-05 NOTE — Progress Notes (Signed)
SUBJECTIVE:  22 y.o. female complains of white and thick vaginal discharge with vaginal itching for 3 day(s). Denies abnormal vaginal bleeding or significant pelvic pain or fever. No UTI symptoms. Denies history of known exposure to STD.  No LMP recorded.  OBJECTIVE:  She appears well, afebrile. Urine dipstick: not done.  ASSESSMENT:  Vaginal Discharge  Vaginal Odor   PLAN:  GC, chlamydia, trichomonas, BVAG, CVAG probe sent to lab. Treatment: To be determined once lab results are received ROV prn if symptoms persist or worsen.

## 2022-11-08 ENCOUNTER — Other Ambulatory Visit: Payer: Self-pay

## 2022-11-08 DIAGNOSIS — N760 Acute vaginitis: Secondary | ICD-10-CM

## 2022-11-08 LAB — CERVICOVAGINAL ANCILLARY ONLY
Bacterial Vaginitis (gardnerella): NEGATIVE
Candida Glabrata: NEGATIVE
Candida Vaginitis: POSITIVE — AB
Chlamydia: NEGATIVE
Comment: NEGATIVE
Comment: NEGATIVE
Comment: NEGATIVE
Comment: NEGATIVE
Comment: NEGATIVE
Comment: NORMAL
Neisseria Gonorrhea: NEGATIVE
Trichomonas: NEGATIVE

## 2022-11-08 MED ORDER — METRONIDAZOLE 500 MG PO TABS: 500.0000 mg | ORAL_TABLET | Freq: Two times a day (BID) | ORAL | 0 refills | Status: DC

## 2022-12-30 ENCOUNTER — Ambulatory Visit: Payer: Medicaid Other

## 2023-01-01 ENCOUNTER — Emergency Department (HOSPITAL_BASED_OUTPATIENT_CLINIC_OR_DEPARTMENT_OTHER)
Admission: EM | Admit: 2023-01-01 | Discharge: 2023-01-01 | Disposition: A | Discharge status: Home / Self Care | Payer: Medicaid Other | Attending: Emergency Medicine | Admitting: Emergency Medicine

## 2023-01-01 ENCOUNTER — Other Ambulatory Visit: Payer: Self-pay

## 2023-01-01 ENCOUNTER — Encounter (HOSPITAL_BASED_OUTPATIENT_CLINIC_OR_DEPARTMENT_OTHER): Payer: Self-pay | Admitting: Emergency Medicine

## 2023-01-01 ENCOUNTER — Emergency Department (HOSPITAL_BASED_OUTPATIENT_CLINIC_OR_DEPARTMENT_OTHER): Payer: Medicaid Other | Admitting: Radiology

## 2023-01-01 DIAGNOSIS — S60222A Contusion of left hand, initial encounter: Secondary | ICD-10-CM | POA: Insufficient documentation

## 2023-01-01 DIAGNOSIS — Y92410 Unspecified street and highway as the place of occurrence of the external cause: Secondary | ICD-10-CM | POA: Diagnosis not present

## 2023-01-01 DIAGNOSIS — S39012A Strain of muscle, fascia and tendon of lower back, initial encounter: Secondary | ICD-10-CM | POA: Insufficient documentation

## 2023-01-01 DIAGNOSIS — S3992XA Unspecified injury of lower back, initial encounter: Secondary | ICD-10-CM | POA: Diagnosis present

## 2023-01-01 LAB — PREGNANCY, URINE: Preg Test, Ur: NEGATIVE

## 2023-01-01 MED ORDER — CYCLOBENZAPRINE HCL 10 MG PO TABS: 10.0000 mg | ORAL_TABLET | Freq: Two times a day (BID) | ORAL | 0 refills | Status: DC | PRN

## 2023-01-01 NOTE — Discharge Instructions (Addendum)
All the x-rays look normal today.  You are can to be very sore over the next few days.  Do Tylenol, ibuprofen and muscle relaxer as needed for pain.
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
## 2023-02-23 ENCOUNTER — Ambulatory Visit (HOSPITAL_COMMUNITY)
Admission: EM | Admit: 2023-02-23 | Discharge: 2023-02-23 | Disposition: A | Discharge status: Home / Self Care | Payer: Medicaid Other | Attending: Emergency Medicine | Admitting: Emergency Medicine

## 2023-02-23 ENCOUNTER — Encounter (HOSPITAL_COMMUNITY): Payer: Self-pay | Admitting: Emergency Medicine

## 2023-02-23 DIAGNOSIS — Z3202 Encounter for pregnancy test, result negative: Secondary | ICD-10-CM | POA: Diagnosis not present

## 2023-02-23 DIAGNOSIS — N898 Other specified noninflammatory disorders of vagina: Secondary | ICD-10-CM | POA: Insufficient documentation

## 2023-02-23 DIAGNOSIS — N3000 Acute cystitis without hematuria: Secondary | ICD-10-CM | POA: Insufficient documentation

## 2023-02-23 DIAGNOSIS — Z113 Encounter for screening for infections with a predominantly sexual mode of transmission: Secondary | ICD-10-CM | POA: Insufficient documentation

## 2023-02-23 LAB — POCT URINALYSIS DIP (MANUAL ENTRY)
Bilirubin, UA: NEGATIVE
Glucose, UA: NEGATIVE mg/dL
Ketones, POC UA: NEGATIVE mg/dL
Nitrite, UA: NEGATIVE
Protein Ur, POC: 30 mg/dL — AB
Spec Grav, UA: 1.025 (ref 1.010–1.025)
Urobilinogen, UA: 0.2 E.U./dL
pH, UA: 6 (ref 5.0–8.0)

## 2023-02-23 LAB — POCT URINE PREGNANCY: Preg Test, Ur: NEGATIVE

## 2023-02-23 MED ORDER — SULFAMETHOXAZOLE-TRIMETHOPRIM 800-160 MG PO TABS: 1.0000 | ORAL_TABLET | Freq: Two times a day (BID) | ORAL | 0 refills | Status: AC

## 2023-02-23 NOTE — ED Provider Notes (Signed)
[facility]    CSN: [id number] Arrival date & time: 02/23/23  1231      History   Chief Complaint Chief Complaint  Patient presents with   Abdominal Pain   Back Pain   Vaginal Bleeding    HPI BRIANI MARICONDA is a 22 y.o. female.   Patient presents to clinic for left lower abdominal pain, flank pain, dysuria and urinary odor for the past few days.  Reports her symptoms are consistent when she had a urinary tract infection in the past.  Denies any fevers, nausea, or vomiting.   She had a medically induced abortion with pills in March.  She did have some menstrual cycles in April.  She was placed on oral contraceptives, has since stopped taking these, she was forgetful and taking them irregularly.  She last had unprotected intercourse on the 17th.  She does have some vaginal bleeding and cramping.  Reports she has been having continued pain with intercourse since her abortion.  Has since had a pelvic exam that was reportedly normal.    The history is provided by the patient and medical records.  Abdominal Pain Associated symptoms: dysuria, vaginal bleeding and vaginal discharge   Associated symptoms: no fever, no nausea and no vomiting   Back Pain Associated symptoms: abdominal pain and dysuria   Associated symptoms: no fever   Vaginal Bleeding Associated symptoms: abdominal pain, dysuria and vaginal discharge   Associated symptoms: no fever and no nausea     Past Medical History:  Diagnosis Date   Asthma     Patient Active Problem List   Diagnosis Date Noted   Migraine without aura and without status migrainosus, not intractable 11/21/2018   Tension headache 11/21/2018   Anxiety state 11/21/2018   Sleeping difficulty 11/21/2018    Past Surgical History:  Procedure Laterality Date   NO PAST SURGERIES      OB History     Gravida  0   Para  0   Term  0   Preterm  0   AB  0   Living  0      SAB  0   IAB  0   Ectopic  0   Multiple   0   Live Births  0            Home Medications    Prior to Admission medications   Medication Sig Start Date End Date Taking? Authorizing Provider  sulfamethoxazole-trimethoprim (BACTRIM DS) 800-160 MG tablet Take 1 tablet by mouth 2 (two) times daily for 3 days. 02/23/23 02/26/23 Yes Rinaldo Ratel, [place] N, FNP  amitriptyline (ELAVIL) 25 MG tablet Take 1 tablet (25 mg total) by mouth at bedtime. Patient not taking: Reported on 05/24/2019 11/21/18 06/13/19  Keturah Shavers, MD    Family History Family History  Problem Relation Age of Onset   Hyperlipidemia Mother    Healthy Father    Migraines Neg Hx    Seizures Neg Hx    Autism Neg Hx    ADD / ADHD Neg Hx    Anxiety disorder Neg Hx    Depression Neg Hx    Bipolar disorder Neg Hx    Schizophrenia Neg Hx     Social History Social History   Tobacco Use   Smoking status: Never   Smokeless tobacco: Never  Vaping Use   Vaping Use: Never used  Substance Use Topics   Alcohol use: Yes   Drug use: Yes    Frequency: 2.0  times per week    Types: Marijuana    Comment: weekly     Allergies   Patient has no known allergies.   Review of Systems Review of Systems  Constitutional:  Negative for fever.  Gastrointestinal:  Positive for abdominal pain. Negative for nausea and vomiting.  Genitourinary:  Positive for dysuria, flank pain, menstrual problem, vaginal bleeding and vaginal discharge.     Physical Exam Triage Vital Signs ED Triage Vitals  Enc Vitals Group     BP 02/23/23 1259 (!) 135/90     Pulse Rate 02/23/23 1259 67     Resp 02/23/23 1259 16     Temp 02/23/23 1259 98.5 F (36.9 C)     Temp Source 02/23/23 1259 Oral     SpO2 02/23/23 1259 98 %     Weight --      Height --      Head Circumference --      Peak Flow --      Pain Score 02/23/23 1256 8     Pain Loc --      Pain Edu? --      Excl. in GC? --    No data found.  Updated Vital Signs BP (!) 135/90 (BP Location: Right Arm)   Pulse 67   Temp  98.5 F (36.9 C) (Oral)   Resp 16   LMP 02/11/2023   SpO2 98%   Visual Acuity Right Eye Distance:   Left Eye Distance:   Bilateral Distance:    Right Eye Near:   Left Eye Near:    Bilateral Near:     Physical Exam Vitals and nursing note reviewed.  Constitutional:      Appearance: She is well-developed.  HENT:     Head: Normocephalic and atraumatic.     Mouth/Throat:     Mouth: Mucous membranes are moist.     Pharynx: Oropharynx is clear.  Eyes:     General: No scleral icterus. Cardiovascular:     Rate and Rhythm: Normal rate and regular rhythm.     Heart sounds: Normal heart sounds. No murmur heard. Pulmonary:     Effort: Pulmonary effort is normal. No respiratory distress.  Abdominal:     General: Abdomen is flat. Bowel sounds are normal.     Palpations: Abdomen is soft.     Tenderness: There is abdominal tenderness in the left lower quadrant. There is no right CVA tenderness, left CVA tenderness, guarding or rebound.     Hernia: No hernia is present.  Skin:    General: Skin is warm and dry.  Neurological:     General: No focal deficit present.     Mental Status: She is alert.  Psychiatric:        Mood and Affect: Mood normal.      UC Treatments / Results  Labs (all labs ordered are listed, but only abnormal results are displayed) Labs Reviewed  POCT URINALYSIS DIP (MANUAL ENTRY) - Abnormal; Notable for the following components:      Result Value   Color, UA orange (*)    Clarity, UA cloudy (*)    Blood, UA large (*)    Protein Ur, POC =30 (*)    Leukocytes, UA Small (1+) (*)    All other components within normal limits  URINE CULTURE  POCT URINE PREGNANCY  CERVICOVAGINAL ANCILLARY ONLY    EKG   Radiology No results found.  Procedures Procedures (including critical care time)  Medications Ordered in UC  Medications - No data to display  Initial Impression / Assessment and Plan / UC Course  I have reviewed the triage vital signs and the  nursing notes.  Pertinent labs & imaging results that were available during my care of the patient were reviewed by me and considered in my medical decision making (see chart for details).  Vitals and triage reviewed, patient is hemodynamically stable.  Urinalysis with large red blood cells, protein and leukocytes.  Will send for culture, treat with 3 days of Bactrim for acute cystitis.  Urine pregnancy is negative, this is reassuring.  Due to vaginal discharge and being sexually active, cervicovaginal swab obtained.  Staff will contact patient if anything results is abnormal to initiate the appropriate treatment.  She will follow-up with her OB/GYN regarding contraception management and discomfort during intercourse, as evaluation with ultrasound may be indicated.  Abdomen was soft with some mild tenderness to left lower quadrant, could be associated with cramping and menses.  Without rebound or guarding, or signs of peritoneal irritation.  No need for emergent imaging at this time.  Plan of care, follow-up care and return precautions given, no questions at this time.     Final Clinical Impressions(s) / UC Diagnoses   Final diagnoses:  Acute cystitis without hematuria  Vaginal discharge  Urine pregnancy test negative  Screening examination for sexually transmitted disease   Discharge Instructions   None    ED Prescriptions     Medication Sig Dispense Auth. Provider   sulfamethoxazole-trimethoprim (BACTRIM DS) 800-160 MG tablet Take 1 tablet by mouth 2 (two) times daily for 3 days. 6 tablet Arjan Strohm, [place] N, [place]      PDMP not reviewed this encounter.   Gerrit Rafalski, [place] N, [place] 02/23/23 1323

## 2023-02-23 NOTE — ED Triage Notes (Signed)
Pt reports had an abortion back in March. Reports started having menstrual cycles in April. May 13-17 then bled again a few days afterwards. Then bled 6/7-9 then started having vaginal bleeding again today. Last night started having abd pains as well as back pains.  Also adds since abortion, when has intercourse has pain. Reports had an exam done and was told everything was normal.  Pt unsure if having UTI due to foul odor and burning with urination  Hasn't taken any medications for pain

## 2023-02-24 LAB — CERVICOVAGINAL ANCILLARY ONLY
Bacterial Vaginitis (gardnerella): NEGATIVE
Candida Glabrata: NEGATIVE
Candida Vaginitis: NEGATIVE
Chlamydia: NEGATIVE
Comment: NEGATIVE
Comment: NEGATIVE
Comment: NEGATIVE
Comment: NEGATIVE
Comment: NEGATIVE
Comment: NORMAL
Neisseria Gonorrhea: NEGATIVE
Trichomonas: NEGATIVE

## 2023-02-24 LAB — URINE CULTURE

## 2023-02-25 LAB — URINE CULTURE: Culture: 60000 — AB

## 2023-03-01 ENCOUNTER — Ambulatory Visit: Payer: Medicaid Other

## 2023-03-23 ENCOUNTER — Encounter: Payer: Self-pay | Admitting: Family Medicine

## 2023-03-23 ENCOUNTER — Ambulatory Visit: Payer: Medicaid Other | Admitting: Family Medicine

## 2023-03-23 VITALS — BP 122/84 | HR 82 | Temp 98.2°F | Ht 59.0 in | Wt 127.4 lb

## 2023-03-23 DIAGNOSIS — F339 Major depressive disorder, recurrent, unspecified: Secondary | ICD-10-CM

## 2023-03-23 DIAGNOSIS — G43009 Migraine without aura, not intractable, without status migrainosus: Secondary | ICD-10-CM | POA: Diagnosis not present

## 2023-03-23 DIAGNOSIS — F419 Anxiety disorder, unspecified: Secondary | ICD-10-CM | POA: Diagnosis not present

## 2023-03-23 DIAGNOSIS — Z7689 Persons encountering health services in other specified circumstances: Secondary | ICD-10-CM

## 2023-03-23 MED ORDER — AMITRIPTYLINE HCL 25 MG PO TABS
25.0000 mg | ORAL_TABLET | Freq: Every day | ORAL | 3 refills | Status: DC
Start: 2023-03-23 — End: 2023-04-06

## 2023-03-23 NOTE — Progress Notes (Signed)
Established Patient Office Visit   Subjective  Patient ID: Sharon Mcpherson, female    DOB: 09-13-2000  Age: 22 y.o. MRN: [id number]  Chief Complaint  Patient presents with   Establish Care    Starting medication for migraines. States she gets BV and yeast infections a lot, would like to discuss that     Patient is a 22 year old female seen for establish care and follow-up on ongoing concerns.  Patient previously seen by Delbert Harness, MD.  Anxiety-depression: Patient endorses past history of trauma.  Tried Lexapro 10 mg but it did not work/cause drowsiness.  Tried hydroxyzine as needed for anxiety.  Thinks may have tried Zoloft.  GI concerns: Patient endorses frequent BMs sometimes solid and sometimes loose.  Gyn concerns: Tried OCPs but had inconsistent use due to sporadic schedule.  Previously on Nexplanon but had removed in October or November 2023.  Patient notes history of frequent BV and yeast infections after menses.  H/o Migraines: dx'd as a teen.  Having almost daily.  Gets a throbbing in head, L side/frontal pain or all around head.  Some changes in vision noted, nausea, light sensitivity, some sound sensitivity.  Patient wearing contacts.  Notes prescription recently changed but not much of a difference.  May have some aura as ears were ringing or feels weird like a nauseous feeling then a headache starts.  Social history: Patient is a Insurance underwriter.  States may work late so sleep is all over the place.    Past Medical History:  Diagnosis Date   Asthma    Past Surgical History:  Procedure Laterality Date   NO PAST SURGERIES     Social History   Tobacco Use   Smoking status: Never   Smokeless tobacco: Never  Vaping Use   Vaping status: Never Used  Substance Use Topics   Alcohol use: Yes   Drug use: Yes    Frequency: 2.0 times per week    Types: Marijuana    Comment: weekly   Family History  Problem Relation Age of Onset   Hyperlipidemia Mother    Healthy  Father    Migraines Neg Hx    Seizures Neg Hx    Autism Neg Hx    ADD / ADHD Neg Hx    Anxiety disorder Neg Hx    Depression Neg Hx    Bipolar disorder Neg Hx    Schizophrenia Neg Hx    No Known Allergies    ROS Negative unless stated above    Objective:     BP 122/84 (BP Location: Right Arm, Patient Position: Sitting, Cuff Size: Normal)   Pulse 82   Temp 98.2 F (36.8 C) (Oral)   Ht 4\' 11"  (1.499 m)   Wt 127 lb 6.4 oz (57.8 kg)   LMP 03/13/2023   SpO2 97%   BMI 25.73 kg/m  BP Readings from Last 3 Encounters:  03/23/23 122/84  02/23/23 (!) 135/90  01/01/23 (!) 132/100   Wt Readings from Last 3 Encounters:  03/23/23 127 lb 6.4 oz (57.8 kg)  01/01/23 130 lb (59 kg)  09/23/22 133 lb 4.8 oz (60.5 kg)      Physical Exam Constitutional:      General: She is not in acute distress.    Appearance: Normal appearance.  HENT:     Head: Normocephalic and atraumatic.     Nose: Nose normal.     Mouth/Throat:     Mouth: Mucous membranes are moist.  Cardiovascular:  Rate and Rhythm: Normal rate and regular rhythm.     Heart sounds: Normal heart sounds. No murmur heard.    No gallop.  Pulmonary:     Effort: Pulmonary effort is normal. No respiratory distress.     Breath sounds: Normal breath sounds. No wheezing, rhonchi or rales.  Skin:    General: Skin is warm and dry.  Neurological:     Mental Status: She is alert and oriented to person, place, and time.       03/23/2023   11:17 AM 11/05/2022   10:34 AM  Depression screen PHQ 2/9  Decreased Interest 1 0  Down, Depressed, Hopeless 2 0  PHQ - 2 Score 3 0  Altered sleeping 3 0  Tired, decreased energy 3 0  Change in appetite 2 0  Feeling bad or failure about yourself  1 0  Trouble concentrating 1 0  Moving slowly or fidgety/restless 1 0  Suicidal thoughts 0 0  PHQ-9 Score 14 0  Difficult doing work/chores Very difficult       03/23/2023   11:17 AM 11/05/2022   10:34 AM  GAD 7 : Generalized Anxiety Score   Nervous, Anxious, on Edge 3 0  Control/stop worrying 3 0  Worry too much - different things 3 0  Trouble relaxing 1 0  Restless 2 0  Easily annoyed or irritable 3 0  Afraid - awful might happen 3 0  Total GAD 7 Score 18 0  Anxiety Difficulty Very difficult       No results found for any visits on 03/23/23.    Assessment & Plan:  Anxiety -GAD-7 score 18 -Discussed the importance of self-care -Patient to consider counseling  Depression, recurrent (HCC) -PHQ-9 score 14 -Advised to consider counseling.  Given resources. -Discussed the importance of medication compliance -     Amitriptyline HCl; Take 1 tablet (25 mg total) by mouth at bedtime.  Dispense: 30 tablet; Refill: 3  Migraine without aura and without status migrainosus, not intractable -Discussed headache prevention -Given frequency of headaches discussed preventative med occasions and general abortive measures. -Will start amitriptyline to help with headaches and depression symptoms.  Advised on the importance of compliance -For continued or worsening symptoms referral to neurology. -     Amitriptyline HCl; Take 1 tablet (25 mg total) by mouth at bedtime.  Dispense: 30 tablet; Refill: 3  Encounter to establish care -We reviewed the PMH, PSH, FH, SH, Meds and Allergies. -We provided refills for any medications we will prescribe as needed. -We addressed current concerns per orders and patient instructions. -We have asked for records for pertinent exams, studies, vaccines and notes from previous providers. -We have advised patient to follow up per instructions below.   Return in about 6 weeks (around 05/04/2023).   Deeann Saint, MD

## 2023-03-23 NOTE — Patient Instructions (Signed)
GENERAL HEADACHE INSTRUCTIONS Headache Preventive Treatment: Please keep in mind that it takes 4-6 weeks for the medication to start working well and 2-3 months at the appropriate dose before deciding if it will be useful or not. If it is not helping at all by this time, then we will discuss other medications to try. Supplements may take 3-6 months until you see full effect.    Natural supplements: Magnesium Oxide or Magnesium Glycinate 500 mg at bed (up to 800 mg daily) Coenzyme Q10 300 mg in AM Vitamin B2- 200 mg twice a day   Add 1 supplement at a time since even natural supplements can have undesirable side effects. You can sometimes buy supplements cheaper (especially Coenzyme Q10) at www.WebmailGuide.co.za or at [employer].   Vitamins and herbs that show potential:   Magnesium: Magnesium (250 mg twice a day or 500 mg at bed) has a relaxant effect on smooth muscles such as blood vessels. Individuals suffering from frequent or daily headache usually have low magnesium levels which can be increase with daily supplementation of 400-750 mg. Three trials found 40-90% average headache reduction  when used as a preventative. Magnesium also demonstrated the benefit in menstrually related migraine.  Magnesium is part of the messenger system in the serotonin cascade and it is a good muscle relaxant.  It is also useful for constipation which can be a side effect of other medications used to treat migraine. Good sources include nuts, whole grains, and tomatoes. Side Effects: loose stool/diarrhea Riboflavin (vitamin B 2) 200 mg twice a day. This vitamin assists nerve cells in the production of ATP a principal energy storing molecule.  It is necessary for many chemical reactions in the body.  There have been at least 3 clinical trials of riboflavin using 400 mg per day all of which suggested that migraine frequency can be decreased.  All 3 trials showed significant improvement in over half of migraine sufferers.  The  supplement is found in bread, cereal, milk, meat, and poultry.  Most Americans get more riboflavin than the recommended daily allowance, however riboflavin deficiency is not necessary for the supplements to help prevent headache. Side effects: energizing, green urine   Coenzyme Q10: This is present in almost all cells in the body and is critical component for the conversion of energy.  Recent studies have shown that a nutritional supplement of CoQ10 can reduce the frequency of migraine attacks by improving the energy production of cells as with riboflavin.  Doses of 150 mg twice a day have been shown to be effective.   Melatonin: Increasing evidence shows correlation between melatonin secretion and headache conditions.  Melatonin supplementation has decreased headache intensity and duration.  It is widely used as a sleep aid.  Sleep is natures way of dealing with migraine.  A dose of 3 mg is recommended to start for headaches including cluster headache. Higher doses up to 15 mg has been reviewed for use in Cluster headache and have been used. The rationale behind using melatonin for cluster is that many theories regarding the cause of Cluster headache center around the disruption of the normal circadian rhythm in the brain.  This helps restore the normal circadian rhythm.     HEADACHE DIET: Foods and beverages which may trigger migraine Note that only 20% of headache patients are food sensitive. You will know if you are food sensitive if you get a headache consistently 20 minutes to 2 hours after eating a certain food. Only cut out a food  if it causes headaches, otherwise you might remove foods you enjoy! What matters most for diet is to eat a well balanced healthy diet full of vegetables and low fat protein, and to not miss meals.   Chocolate, other sweets ALL cheeses except cottage and cream cheese Dairy products, yogurt, sour cream, ice cream Liver Meat extracts (Bovril, Marmite, meat  tenderizers) Meats or fish which have undergone aging, fermenting, pickling or smoking. These include: Hotdogs,salami,Lox,sausage, mortadellas,smoked salmon, pepperoni, Pickled herring Pods of broad bean (English beans, Chinese pea pods, [place] (fava) beans, lima and navy beans Ripe avocado, ripe banana Yeast extracts or active yeast preparations such as Brewer's or Fleishman's (commercial bakes goods are permitted) Tomato based foods, pizza (lasagna, etc.)   MSG (monosodium glutamate) is disguised as many things; look for these common aliases: Monopotassium glutamate Autolysed yeast Hydrolysed protein Sodium caseinate "flavorings" "all natural preservatives" Nutrasweet   Avoid all other foods that convincingly provoke headaches.   Resources: The Dizzy [place] Laundry Your Headache Diet, migrainestrong.com  https://zamora-andrews.com/   Caffeine and Migraine For patients that have migraine, caffeine intake more than 3 days per week can lead to dependency and increased migraine frequency. I would recommend cutting back on your caffeine intake as best you can. The recommended amount of caffeine is 200-300 mg daily, although migraine patients may experience dependency at even lower doses. While you may notice an increase in headache temporarily, cutting back will be helpful for headaches in the long run. For more information on caffeine and migraine, visit: https://americanmigrainefoundation.org/resource-library/caffeine-and-migraine/   Headache Prevention Strategies:   1. Maintain a headache diary; learn to identify and avoid triggers.  - This can be a simple note where you log when you had a headache, associated symptoms, and medications used - There are several smartphone apps developed to help track migraines: Migraine Buddy, Migraine Monitor, Curelator N1-Headache App   Common triggers include: Emotional triggers: Emotional/Upset family or  friends Emotional/Upset occupation Business reversal/success Anticipation anxiety Crisis-serious Post-crisis periodNew job/position   Physical triggers: Vacation Day Weekend Strenuous Exercise High Altitude Location New Move Menstrual Day Physical Illness Oversleep/Not enough sleep Weather changes Light: Photophobia or light sesnitivity treatment involves a balance between desensitization and reduction in overly strong input. Use dark polarized glasses outside, but not inside. Avoid bright or fluorescent light, but do not dim environment to the point that going into a normally lit room hurts. Consider FL-41 tint lenses, which reduce the most irritating wavelengths without blocking too much light.  These can be obtained at axonoptics.com or theraspecs.com Foods: see list above.   2. Limit use of acute treatments (over-the-counter medications, triptans, etc.) to no more than 2 days per week or 10 days per month to prevent medication overuse headache (rebound headache).     3. Follow a regular schedule (including weekends and holidays): Don't skip meals. Eat a balanced diet. 8 hours of sleep nightly. Minimize stress. Exercise 30 minutes per day. Being overweight is associated with a 5 times increased risk of chronic migraine. Keep well hydrated and drink 6-8 glasses of water per day.   4. Initiate non-pharmacologic measures at the earliest onset of your headache. Rest and quiet environment. Relax and reduce stress. Breathe2Relax is a free app that can instruct you on    some simple relaxtion and breathing techniques. Http://Dawnbuse.com is a    free website that provides teaching videos on relaxation.  Also, there are  many apps that   can be downloaded for "mindful" relaxation.  An app called  YOGA NIDRA will help walk you through mindfulness. Another app called Calm can be downloaded to give you a structured mindfulness guide with daily reminders and skill development. Headspace for  guided meditation Mindfulness Based Stress Reduction Online Course: www.palousemindfulness.com Cold compresses.   5. Don't wait!! Take the maximum allowable dosage of prescribed medication at the first sign of migraine.   6. Compliance:  Take prescribed medication regularly as directed and at the first sign of a migraine.   7. Communicate:  Call your physician when problems arise, especially if your headaches change, increase in frequency/severity, or become associated with neurological symptoms (weakness, numbness, slurred speech, etc.).   8. Headache/pain management therapies: Consider various complementary methods, including medication, behavioral therapy, psychological counselling, biofeedback, massage therapy, acupuncture, dry needling, and other modalities.  Such measures may reduce the need for medications. Counseling for pain management, where patients learn to function and ignore/minimize their pain, seems to work very well.   9. Recommend changing family's attention and focus away from patient's headaches. Instead, emphasize daily activities. If first question of day is 'How are your headaches/Do you have a headache today?', then patient will constantly think about headaches, thus making them worse. Goal is to re-direct attention away from headaches, toward daily activities and other distractions.   10. Helpful Websites: www.AmericanHeadacheSociety.org PatentHood.ch www.headaches.org TightMarket.nl www.achenet.org   11. HEADACHE EXPECTATIONS: There are many types of headaches, and only a rare few in which complete relief can be expected.  In general, there is no cure for headache, especially migraine based headaches.  There is nothing available that completely prevents headaches from occurring, breaking through, or having periodic flare-ups and fluctuations.  Regardless of what you are using on a daily basis for prevention, episodic headaches should still be expected, and  periods where frequency may escalate and fluctuate are unavoidable.   There is no quick fix for most headaches.  Furthermore, the longer you have had high frequency headaches (such as chronic daily headache), the longer it will likely take to expect any improvement.  In fact, some people will never improve, regardless of how many medications or other treatments we try.  Our treatment strategy is to evaluate for possible causes of your headache, although testing is usually always normal, even in cases of daily continuous headaches for years.  Most types of headache such as migraine are electrical brain disorders (similar to how epilepsy is an electrical brain disorders).  Therefore, there is no testing that will reveal this "dysfunctional electrical circuitry" such on MRI, or other testing.  We try to find a medication that may help lessen the frequency and/or severity of your headaches.  The goal is not to completely stop them from happening, although if that happens, great!  Different people respond to different medications, and some people just don't respond to anything, so it's usually a matter of trying different options.  We cannot predict if or when exactly you will respond to a treatment that we provide.   Preventive headache medications take 4-6 weeks to start working, and 2-3 months to see full effect, assuming you reach an effective dose.  Therefore, calling or messaging frequently because you have a headache flare prior to the 3 month mark is unlikely to change anything, and unfortunately there is nothing available that will expedite this, so please try to avoid this.  Our recommendation will generally be to give it adequate time first.  If you are unable to wait it out for medications to work, we can  also try IV infusions for some temporary relief.     In general, the best that preventive medications or other treatments (including Botox) are able to offer in migraine management (variable in other  headache types) is a 50% improvement in frequency and/or severity of headache.  That is our goal, and any additional benefit is considered a bonus.  Some people do significantly better than this, others do not get close to this.  Therefore, if your headaches are not improving by at least 3 months on your preventive strategy, contact us and we can discuss further adjustments.  Keep in mind that complete headache cure is not a realistic expectation.   Behavioral Health Services: -to make an appointment contact the office/provider you are interested in seeing.  No referral is needed.  The below is not an all inclusive list, but will help you get started.  [email or web address] -counseling located off of [address]  [email or web address] -website helps you find providers in your area  [facility] -Located off of [address] across from [place] [address]  Dr. Jannifer Franklin is a Therapist, sports with [facility]. [phone]  [facility]  [facility]  -[address]  [phone] -a place in town that has counseling and Psychiatry services.

## 2023-04-06 ENCOUNTER — Other Ambulatory Visit: Payer: Self-pay | Admitting: Family Medicine

## 2023-04-06 DIAGNOSIS — F339 Major depressive disorder, recurrent, unspecified: Secondary | ICD-10-CM

## 2023-04-06 DIAGNOSIS — G43009 Migraine without aura, not intractable, without status migrainosus: Secondary | ICD-10-CM

## 2023-04-14 ENCOUNTER — Ambulatory Visit: Payer: Medicaid Other | Admitting: Family Medicine

## 2023-04-18 ENCOUNTER — Ambulatory Visit: Payer: Medicaid Other | Admitting: Family Medicine

## 2023-04-18 ENCOUNTER — Encounter: Payer: Self-pay | Admitting: Family Medicine

## 2023-04-18 ENCOUNTER — Other Ambulatory Visit (HOSPITAL_COMMUNITY)
Admission: RE | Admit: 2023-04-18 | Discharge: 2023-04-18 | Disposition: A | Discharge status: Home / Self Care | Payer: Medicaid Other | Source: Ambulatory Visit | Attending: Family Medicine | Admitting: Family Medicine

## 2023-04-18 VITALS — BP 110/60 | HR 81 | Temp 98.0°F | Wt 131.2 lb

## 2023-04-18 DIAGNOSIS — B379 Candidiasis, unspecified: Secondary | ICD-10-CM

## 2023-04-18 DIAGNOSIS — F339 Major depressive disorder, recurrent, unspecified: Secondary | ICD-10-CM

## 2023-04-18 DIAGNOSIS — R300 Dysuria: Secondary | ICD-10-CM

## 2023-04-18 DIAGNOSIS — G43009 Migraine without aura, not intractable, without status migrainosus: Secondary | ICD-10-CM

## 2023-04-18 DIAGNOSIS — F419 Anxiety disorder, unspecified: Secondary | ICD-10-CM | POA: Diagnosis not present

## 2023-04-18 DIAGNOSIS — N760 Acute vaginitis: Secondary | ICD-10-CM | POA: Insufficient documentation

## 2023-04-18 DIAGNOSIS — T3695XA Adverse effect of unspecified systemic antibiotic, initial encounter: Secondary | ICD-10-CM

## 2023-04-18 DIAGNOSIS — R3989 Other symptoms and signs involving the genitourinary system: Secondary | ICD-10-CM

## 2023-04-18 LAB — POCT URINALYSIS DIPSTICK
Bilirubin, UA: NEGATIVE
Blood, UA: 10
Glucose, UA: NEGATIVE
Ketones, UA: NEGATIVE
Nitrite, UA: NEGATIVE
Protein, UA: POSITIVE — AB
Spec Grav, UA: 1.02 (ref 1.010–1.025)
Urobilinogen, UA: 0.2 E.U./dL
pH, UA: 6 (ref 5.0–8.0)

## 2023-04-18 MED ORDER — METRONIDAZOLE 500 MG PO TABS
500.0000 mg | ORAL_TABLET | Freq: Two times a day (BID) | ORAL | 1 refills | Status: DC
Start: 2023-04-18 — End: 2023-04-18

## 2023-04-18 MED ORDER — FLUCONAZOLE 150 MG PO TABS
ORAL_TABLET | ORAL | 0 refills | Status: DC
Start: 2023-04-18 — End: 2023-11-14

## 2023-04-18 MED ORDER — AMITRIPTYLINE HCL 50 MG PO TABS: 50.0000 mg | ORAL_TABLET | Freq: Every day | ORAL | 1 refills | Status: DC

## 2023-04-18 MED ORDER — METRONIDAZOLE 500 MG PO TABS
500.0000 mg | ORAL_TABLET | Freq: Two times a day (BID) | ORAL | 1 refills | Status: AC
Start: 2023-04-18 — End: 2023-04-25

## 2023-04-18 MED ORDER — AMOXICILLIN 500 MG PO TABS
500.0000 mg | ORAL_TABLET | Freq: Two times a day (BID) | ORAL | 0 refills | Status: AC
Start: 2023-04-18 — End: 2023-04-25

## 2023-04-18 NOTE — Progress Notes (Signed)
Established Patient Office Visit   Subjective  Patient ID: Sharon Mcpherson, female    DOB: [birth year]  Age: 22 y.o. MRN: [id number]  Chief Complaint  Patient presents with   Anxiety    Patient also has possible bv or yeast     Pt is a 22 yo female seen for f/u and acute concerns.  Pt thinks she is getting BV.  Notes increased d/c irritation starting over the weekend.  Patient took 3 leftover Flagyl tablets but wanted to get checked.  Patient notes history of frequent BV typically occurring after menses.  LMP was the end of July.  Patient denies changes in soaps, lotions, detergents.  Patient drinks mainly water and kombucha.  Pt thinks she may also be getting a UTI as having some abd discomfort.  Typically does not get dysuria.  Also notes increased anxiety.  Taking Elavil 25 mg daily for migraines, anxiety, depression.  Notes some increased stress which may be contributing to increased anxiety symptoms.  Patient also notes continued diarrhea and constipation denies family history of IBS or bowel symptoms.  Anxiety     Past Medical History:  Diagnosis Date   Asthma    Past Surgical History:  Procedure Laterality Date   NO PAST SURGERIES     Social History   Tobacco Use   Smoking status: Never   Smokeless tobacco: Never  Vaping Use   Vaping status: Never Used  Substance Use Topics   Alcohol use: Yes   Drug use: Yes    Frequency: 2.0 times per week    Types: Marijuana    Comment: weekly   Family History  Problem Relation Age of Onset   Hyperlipidemia Mother    Healthy Father    Migraines Neg Hx    Seizures Neg Hx    Autism Neg Hx    ADD / ADHD Neg Hx    Anxiety disorder Neg Hx    Depression Neg Hx    Bipolar disorder Neg Hx    Schizophrenia Neg Hx    No Known Allergies    ROS Negative unless stated above    Objective:     BP 110/60 (BP Location: Left Arm, Patient Position: Sitting, Cuff Size: Normal)   Pulse 81   Temp 98 F (36.7 C)  (Oral)   Wt 131 lb 3.2 oz (59.5 kg)   LMP 04/06/2023   SpO2 98%   BMI 26.50 kg/m    Physical Exam Constitutional:      General: She is not in acute distress.    Appearance: Normal appearance.  HENT:     Head: Normocephalic and atraumatic.     Nose: Nose normal.     Mouth/Throat:     Mouth: Mucous membranes are moist.  Cardiovascular:     Rate and Rhythm: Normal rate and regular rhythm.     Heart sounds: Normal heart sounds. No murmur heard.    No gallop.  Pulmonary:     Effort: Pulmonary effort is normal. No respiratory distress.     Breath sounds: Normal breath sounds. No wheezing, rhonchi or rales.  Genitourinary:    Comments: aptima self swab obtained. Skin:    General: Skin is warm and dry.  Neurological:     Mental Status: She is alert and oriented to person, place, and time.    Results for orders placed or performed in visit on 04/18/23  POCT urinalysis dipstick  Result Value Ref Range   Color, UA yellow  Clarity, UA clear    Glucose, UA Negative Negative   Bilirubin, UA neg    Ketones, UA neg    Spec Grav, UA 1.020 1.010 - 1.025   Blood, UA 10    pH, UA 6.0 5.0 - 8.0   Protein, UA Positive (A) Negative   Urobilinogen, UA 0.2 0.2 or 1.0 E.U./dL   Nitrite, UA neg    Leukocytes, UA Trace (A) Negative   Appearance     Odor        Assessment & Plan:  Acute vaginitis -     Cervicovaginal ancillary only -     metroNIDAZOLE; Take 1 tablet (500 mg total) by mouth 2 (two) times daily for 7 days.  Dispense: 14 tablet; Refill: 1  Dysuria -     POCT urinalysis dipstick  Anxiety -GAD 7 score 15 -Mild improvement in GAD-7 score from last visit -Will increase dose of amitriptyline from 25 mg to 50 mg nightly -Consider counseling -Self-care and mindfulness encouraged for continued or worsening symptoms consider additional medication options -Anxiety may be contributing to frequent bowel symptoms.  Advised to consider referral to GI.  Depression, recurrent  (HCC) -PHQ 9 score 16 -counseling encouraged -will increase Amitriptyline from 25 mg to 50 mg at bedtime -discussed the importance of self care. -     Amitriptyline HCl; Take 1 tablet (50 mg total) by mouth at bedtime.  Dispense: 90 tablet; Refill: 1  Migraine without aura and without status migrainosus, not intractable -     Amitriptyline HCl; Take 1 tablet (50 mg total) by mouth at bedtime.  Dispense: 90 tablet; Refill: 1  Suspected UTI -POC UA with Trace Leuks -obtain culture if able to provide additional sample  - Plan: amoxicillin (AMOXIL) 500 MG tablet  Antibiotic-induced yeast infection  - Plan: fluconazole (DIFLUCAN) 150 MG tablet   Return in about 7 weeks (around 06/06/2023), or if symptoms worsen or fail to improve.   Deeann Saint, MD

## 2023-05-28 ENCOUNTER — Encounter (HOSPITAL_COMMUNITY): Payer: Self-pay

## 2023-05-28 ENCOUNTER — Emergency Department (HOSPITAL_COMMUNITY)
Admission: EM | Admit: 2023-05-28 | Discharge: 2023-05-29 | Discharge status: Against Medical Advice | Payer: Medicaid Other | Attending: Emergency Medicine | Admitting: Emergency Medicine

## 2023-05-28 ENCOUNTER — Ambulatory Visit (HOSPITAL_COMMUNITY)
Admission: EM | Admit: 2023-05-28 | Discharge: 2023-05-28 | Discharge status: Against Medical Advice | Payer: Medicaid Other

## 2023-05-28 ENCOUNTER — Other Ambulatory Visit: Payer: Self-pay

## 2023-05-28 DIAGNOSIS — W501XXA Accidental kick by another person, initial encounter: Secondary | ICD-10-CM | POA: Insufficient documentation

## 2023-05-28 DIAGNOSIS — Z5321 Procedure and treatment not carried out due to patient leaving prior to being seen by health care provider: Secondary | ICD-10-CM | POA: Diagnosis not present

## 2023-05-28 DIAGNOSIS — S0591XA Unspecified injury of right eye and orbit, initial encounter: Secondary | ICD-10-CM | POA: Insufficient documentation

## 2023-05-28 MED ORDER — TETRACAINE HCL 0.5 % OP SOLN: 2.0000 [drp] | Freq: Once | OPHTHALMIC | Status: DC

## 2023-05-28 MED ORDER — FLUORESCEIN SODIUM 1 MG OP STRP: 1.0000 | ORAL_STRIP | Freq: Once | OPHTHALMIC | Status: DC

## 2023-05-28 NOTE — ED Notes (Signed)
Pt called for vitals with no response x3.

## 2023-05-28 NOTE — ED Notes (Signed)
Called patient x 1 for triage. No answer.

## 2023-05-28 NOTE — ED Notes (Signed)
Patient called x 3 for triage. No answer.

## 2023-05-28 NOTE — ED Notes (Signed)
Patient called x 2 for triage. No answer.
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
## 2023-05-30 ENCOUNTER — Ambulatory Visit: Payer: Medicaid Other

## 2023-06-06 ENCOUNTER — Ambulatory Visit: Payer: Medicaid Other | Admitting: Family Medicine

## 2023-06-08 ENCOUNTER — Other Ambulatory Visit (HOSPITAL_COMMUNITY)
Admission: RE | Admit: 2023-06-08 | Discharge: 2023-06-08 | Disposition: A | Discharge status: Home / Self Care | Payer: Medicaid Other | Source: Ambulatory Visit | Attending: Family Medicine | Admitting: Family Medicine

## 2023-06-08 ENCOUNTER — Encounter: Payer: Self-pay | Admitting: Family Medicine

## 2023-06-08 ENCOUNTER — Ambulatory Visit: Payer: Medicaid Other | Admitting: Family Medicine

## 2023-06-08 VITALS — BP 110/70 | HR 88 | Temp 98.3°F | Ht 59.0 in | Wt 136.8 lb

## 2023-06-08 DIAGNOSIS — N760 Acute vaginitis: Secondary | ICD-10-CM | POA: Insufficient documentation

## 2023-06-08 DIAGNOSIS — Z113 Encounter for screening for infections with a predominantly sexual mode of transmission: Secondary | ICD-10-CM | POA: Insufficient documentation

## 2023-06-08 DIAGNOSIS — Z23 Encounter for immunization: Secondary | ICD-10-CM

## 2023-06-08 MED ORDER — METRONIDAZOLE 500 MG PO TABS
500.0000 mg | ORAL_TABLET | Freq: Two times a day (BID) | ORAL | 0 refills | Status: AC
Start: 2023-06-08 — End: 2023-06-15

## 2023-06-08 NOTE — Progress Notes (Signed)
Established Patient Office Visit   Subjective  Patient ID: Sharon Mcpherson, female    DOB: 11/04/2000  Age: 22 y.o. MRN: [id number]  Chief Complaint  Patient presents with   std screening     Patient would like blood work +HIV testing, patient states that she might have BV     Patient is a 22 year old female seen for acute concern.  Patient endorses initially making appointment for STI screening, was asymptomatic.  Since scheduling appointment patient states she feels like she is getting BV.  Starting to have whitish vaginal discharge with some odor and mild vaginal irritation.  Patient denies dysuria, fever, chills.  Has not tried anything for symptoms.  Patient states sleep has not been great the last few days but she has been traveling out of state.  Plans to rest today.    Patient Active Problem List   Diagnosis Date Noted   Contraception management 06/24/2021   MDD (major depressive disorder) 08/04/2020   Migraine 08/04/2020   Migraine without aura and without status migrainosus, not intractable 11/21/2018   Tension headache 11/21/2018   Anxiety state 11/21/2018   Sleeping difficulty 11/21/2018   Past Medical History:  Diagnosis Date   Asthma    Past Surgical History:  Procedure Laterality Date   NO PAST SURGERIES     Social History   Tobacco Use   Smoking status: Never   Smokeless tobacco: Never  Vaping Use   Vaping status: Never Used  Substance Use Topics   Alcohol use: Yes   Drug use: Yes    Frequency: 2.0 times per week    Types: Marijuana    Comment: weekly   Family History  Problem Relation Age of Onset   Hyperlipidemia Mother    Healthy Father    Migraines Neg Hx    Seizures Neg Hx    Autism Neg Hx    ADD / ADHD Neg Hx    Anxiety disorder Neg Hx    Depression Neg Hx    Bipolar disorder Neg Hx    Schizophrenia Neg Hx    No Known Allergies    ROS Negative unless stated above    Objective:     BP 110/70 (BP Location:  Right Arm, Patient Position: Sitting, Cuff Size: Normal)   Pulse 88   Temp 98.3 F (36.8 C) (Oral)   Ht 4\' 11"  (1.499 m)   Wt 136 lb 12.8 oz (62.1 kg)   LMP 05/28/2023 (Approximate)   SpO2 99%   BMI 27.63 kg/m  BP Readings from Last 3 Encounters:  06/08/23 110/70  05/28/23 (!) 133/96  04/18/23 110/60   Wt Readings from Last 3 Encounters:  06/08/23 136 lb 12.8 oz (62.1 kg)  05/28/23 130 lb (59 kg)  04/18/23 131 lb 3.2 oz (59.5 kg)      Physical Exam Constitutional:      Appearance: Normal appearance.  HENT:     Head: Normocephalic and atraumatic.     Mouth/Throat:     Mouth: Mucous membranes are moist.  Eyes:     Extraocular Movements: Extraocular movements intact.     Conjunctiva/sclera: Conjunctivae normal.     Pupils: Pupils are equal, round, and reactive to light.     Comments: Right lateral eye conjunctival hemorrhage  Cardiovascular:     Rate and Rhythm: Normal rate.  Pulmonary:     Effort: Pulmonary effort is normal.  Neurological:     Mental Status: She is alert.  No results found for any visits on 06/08/23.    Assessment & Plan:  Acute vaginitis -     Cervicovaginal ancillary only -     metroNIDAZOLE; Take 1 tablet (500 mg total) by mouth 2 (two) times daily for 7 days.  Dispense: 14 tablet; Refill: 0  Screening examination for STI -     HIV Antibody (routine testing w rflx) -     RPR -     Cervicovaginal ancillary only  Need for Tdap vaccination -     Tdap vaccine greater than or equal to 7yo IM  Need for influenza vaccination -     Flu vaccine trivalent PF, 6mos and older(Flulaval,Afluria,Fluarix,Fluzone)  Aptima self swab obtain obtained.  Start Flagyl for presumed BV.  Adjust medication if needed based on results.  HIV RPR testing ordered.  Patient given Tdap and flu vaccines this visit.  Given precautions.  Return if symptoms worsen or fail to improve.   Deeann Saint, MD

## 2023-06-09 LAB — RPR: RPR Ser Ql: NONREACTIVE

## 2023-06-09 LAB — HIV ANTIBODY (ROUTINE TESTING W REFLEX): HIV 1&2 Ab, 4th Generation: NONREACTIVE

## 2023-06-13 LAB — CERVICOVAGINAL ANCILLARY ONLY
Bacterial Vaginitis (gardnerella): POSITIVE — AB
Candida Glabrata: NEGATIVE
Candida Vaginitis: NEGATIVE
Chlamydia: NEGATIVE
Comment: NEGATIVE
Comment: NEGATIVE
Comment: NEGATIVE
Comment: NEGATIVE
Comment: NEGATIVE
Comment: NORMAL
Neisseria Gonorrhea: NEGATIVE
Trichomonas: NEGATIVE

## 2023-07-01 ENCOUNTER — Ambulatory Visit: Payer: Medicaid Other | Admitting: Family Medicine

## 2023-07-11 ENCOUNTER — Ambulatory Visit: Payer: Medicaid Other

## 2023-07-11 ENCOUNTER — Other Ambulatory Visit (HOSPITAL_COMMUNITY)
Admission: RE | Admit: 2023-07-11 | Discharge: 2023-07-11 | Disposition: A | Discharge status: Home / Self Care | Payer: Medicaid Other | Source: Ambulatory Visit | Attending: Obstetrics and Gynecology | Admitting: Obstetrics and Gynecology

## 2023-07-11 VITALS — Ht 59.0 in | Wt 131.1 lb

## 2023-07-11 DIAGNOSIS — N912 Amenorrhea, unspecified: Secondary | ICD-10-CM

## 2023-07-11 DIAGNOSIS — N898 Other specified noninflammatory disorders of vagina: Secondary | ICD-10-CM

## 2023-07-11 DIAGNOSIS — Z3202 Encounter for pregnancy test, result negative: Secondary | ICD-10-CM | POA: Diagnosis not present

## 2023-07-11 LAB — POCT URINE PREGNANCY: Preg Test, Ur: NEGATIVE

## 2023-07-11 NOTE — Progress Notes (Signed)
Patient presents for having an increase in vaginal discharge, but denies odor or irritation. Patient also states that she is 4 days late for her cycle and would like a pregnancy test as well. Desires std screening, but no blood work.  UPT negative today. Patient advised to repeat test in a week if cycle does not come on. Also, advised to allow 24-48 hours for vaginal swab to return.

## 2023-07-12 LAB — CERVICOVAGINAL ANCILLARY ONLY
Bacterial Vaginitis (gardnerella): NEGATIVE
Candida Glabrata: NEGATIVE
Candida Vaginitis: POSITIVE — AB
Chlamydia: NEGATIVE
Comment: NEGATIVE
Comment: NEGATIVE
Comment: NEGATIVE
Comment: NEGATIVE
Comment: NEGATIVE
Comment: NORMAL
Neisseria Gonorrhea: NEGATIVE
Trichomonas: NEGATIVE

## 2023-07-12 MED ORDER — FLUCONAZOLE 150 MG PO TABS: 150.0000 mg | ORAL_TABLET | Freq: Once | ORAL | 0 refills | Status: AC

## 2023-07-12 NOTE — Addendum Note (Signed)
Addended by: Catalina Antigua on: 07/12/2023 12:55 PM   Modules accepted: Orders
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
## 2023-08-14 ENCOUNTER — Ambulatory Visit (HOSPITAL_COMMUNITY): Payer: Medicaid Other

## 2023-09-15 ENCOUNTER — Ambulatory Visit: Payer: Medicaid Other

## 2023-09-19 ENCOUNTER — Ambulatory Visit: Payer: Medicaid Other

## 2023-09-19 ENCOUNTER — Telehealth: Payer: Self-pay

## 2023-09-19 NOTE — Telephone Encounter (Signed)
 Spoke to pt regarding to Nurse Visit for std check.   Inform pt, we don't do nurse visit for std check. And that she would need to be seen with a provider.   Pt states she had it done before with just a nurse.   After checking pt's chart, pt did had it done with OBGYN office in November, 2024.   Inform pt that she had it done with OGBYN office, if she would like to do nurse visit without seeing provider. Advise pt to call her OBGYN.  Pt states she will call her OBGYN office and see when they will have available. She said we can cancel her nurse visit. Pt hang up.

## 2023-09-27 ENCOUNTER — Ambulatory Visit: Payer: Medicaid Other

## 2023-09-27 ENCOUNTER — Other Ambulatory Visit (HOSPITAL_COMMUNITY)
Admission: RE | Admit: 2023-09-27 | Discharge: 2023-09-27 | Disposition: A | Discharge status: Home / Self Care | Payer: Medicaid Other | Source: Ambulatory Visit | Attending: Obstetrics and Gynecology | Admitting: Obstetrics and Gynecology

## 2023-09-27 VITALS — BP 123/85 | HR 71 | Ht 59.0 in | Wt 126.0 lb

## 2023-09-27 DIAGNOSIS — N898 Other specified noninflammatory disorders of vagina: Secondary | ICD-10-CM

## 2023-09-27 NOTE — Progress Notes (Signed)
SUBJECTIVE:  23 y.o. female complains of curd-like and foul vaginal discharge for 7 day(s). Denies abnormal vaginal bleeding or significant pelvic pain or fever. No UTI symptoms. Denies history of known exposure to STD.  Patient's last menstrual period was 09/27/2023 (exact date).  OBJECTIVE:  She appears well, afebrile. Urine dipstick: not done.  ASSESSMENT:  Vaginal Discharge  Vaginal Odor Vagina Itching   PLAN:  GC, chlamydia, trichomonas, BVAG, CVAG probe sent to lab. Treatment: To be determined once lab results are received ROV prn if symptoms persist or worsen.

## 2023-09-28 LAB — CERVICOVAGINAL ANCILLARY ONLY
Bacterial Vaginitis (gardnerella): POSITIVE — AB
Chlamydia: NEGATIVE
Comment: NEGATIVE
Comment: NEGATIVE
Comment: NEGATIVE
Comment: NORMAL
Neisseria Gonorrhea: NEGATIVE
Trichomonas: NEGATIVE

## 2023-09-29 ENCOUNTER — Telehealth: Payer: Self-pay

## 2023-09-29 MED ORDER — METRONIDAZOLE 500 MG PO TABS: 500.0000 mg | ORAL_TABLET | Freq: Two times a day (BID) | ORAL | 0 refills | Status: DC

## 2023-09-29 NOTE — Telephone Encounter (Signed)
Attempted to contact about results, no answer, left vm. RX sent to pharmacy

## 2023-10-07 ENCOUNTER — Other Ambulatory Visit: Payer: Self-pay

## 2023-10-07 MED ORDER — FLUCONAZOLE 150 MG PO TABS: 150.0000 mg | ORAL_TABLET | Freq: Once | ORAL | 0 refills | Status: AC

## 2023-11-10 ENCOUNTER — Ambulatory Visit: Admitting: Family Medicine

## 2023-11-14 ENCOUNTER — Encounter: Payer: Self-pay | Admitting: Family Medicine

## 2023-11-14 ENCOUNTER — Other Ambulatory Visit (HOSPITAL_COMMUNITY)
Admission: RE | Admit: 2023-11-14 | Discharge: 2023-11-14 | Disposition: A | Discharge status: Home / Self Care | Source: Ambulatory Visit | Attending: Family Medicine | Admitting: Family Medicine

## 2023-11-14 ENCOUNTER — Ambulatory Visit: Admitting: Family Medicine

## 2023-11-14 VITALS — BP 100/76 | HR 92 | Temp 98.5°F | Ht 59.0 in | Wt 124.0 lb

## 2023-11-14 DIAGNOSIS — N760 Acute vaginitis: Secondary | ICD-10-CM | POA: Insufficient documentation

## 2023-11-14 DIAGNOSIS — Z113 Encounter for screening for infections with a predominantly sexual mode of transmission: Secondary | ICD-10-CM | POA: Insufficient documentation

## 2023-11-14 NOTE — Progress Notes (Signed)
 Established Patient Office Visit   Subjective  Patient ID: Sharon Mcpherson, female    DOB: [birth year]  Age: 23 y.o. MRN: [id number]  Chief Complaint  Patient presents with   Exposure to STD    Patient is a 23 year old female seen for acute concern.  Patient requesting STI testing.  Endorses thick whitish vaginal discharge x 1 week.  Concern for yeast.  Denies new sexual partners/unprotected sex.  Has a Diflucan at home but did not take it.    Patient Active Problem List   Diagnosis Date Noted   Contraception management 06/24/2021   MDD (major depressive disorder) 08/04/2020   Migraine 08/04/2020   Migraine without aura and without status migrainosus, not intractable 11/21/2018   Tension headache 11/21/2018   Anxiety state 11/21/2018   Sleeping difficulty 11/21/2018   Past Medical History:  Diagnosis Date   Asthma    Past Surgical History:  Procedure Laterality Date   NO PAST SURGERIES     Social History   Tobacco Use   Smoking status: Never   Smokeless tobacco: Never  Vaping Use   Vaping status: Never Used  Substance Use Topics   Alcohol use: Yes   Drug use: Yes    Frequency: 2.0 times per week    Types: Marijuana    Comment: weekly   Family History  Problem Relation Age of Onset   Hyperlipidemia Mother    Healthy Father    Migraines Neg Hx    Seizures Neg Hx    Autism Neg Hx    ADD / ADHD Neg Hx    Anxiety disorder Neg Hx    Depression Neg Hx    Bipolar disorder Neg Hx    Schizophrenia Neg Hx    No Known Allergies    ROS Negative unless stated above    Objective:     BP 100/76 (BP Location: Left Arm, Patient Position: Sitting, Cuff Size: Normal)   Pulse 92   Temp 98.5 F (36.9 C) (Oral)   Ht 4\' 11"  (1.499 m)   Wt 124 lb (56.2 kg)   LMP 10/30/2023 (Exact Date)   SpO2 96%   BMI 25.04 kg/m  BP Readings from Last 3 Encounters:  11/14/23 100/76  09/27/23 123/85  06/08/23 110/70   Wt Readings from Last 3 Encounters:   11/14/23 124 lb (56.2 kg)  09/27/23 126 lb (57.2 kg)  07/11/23 131 lb 1.6 oz (59.5 kg)    Physical Exam Constitutional:      General: She is not in acute distress.    Appearance: Normal appearance.  HENT:     Head: Normocephalic and atraumatic.     Nose: Nose normal.     Mouth/Throat:     Mouth: Mucous membranes are moist.  Cardiovascular:     Rate and Rhythm: Normal rate and regular rhythm.     Heart sounds: Normal heart sounds. No murmur heard.    No gallop.  Pulmonary:     Effort: Pulmonary effort is normal. No respiratory distress.     Breath sounds: Normal breath sounds. No wheezing, rhonchi or rales.  Skin:    General: Skin is warm and dry.  Neurological:     Mental Status: She is alert and oriented to person, place, and time.       11/14/2023    2:22 PM 06/08/2023    8:52 AM 04/18/2023   11:02 AM  Depression screen PHQ 2/9  Decreased Interest 1 0 0  Down, Depressed, Hopeless  0 0 0  PHQ - 2 Score 1 0 0  Altered sleeping 2 0 2  Tired, decreased energy 2  2  Change in appetite 1 0 3  Feeling bad or failure about yourself  0 0 2  Trouble concentrating 0 1 2  Moving slowly or fidgety/restless 0 0 3  Suicidal thoughts  0 0  PHQ-9 Score 6 1 14   Difficult doing work/chores Not difficult at all Not difficult at all Somewhat difficult      11/14/2023    2:23 PM 06/08/2023    8:52 AM 04/18/2023   11:03 AM 03/23/2023   11:17 AM  GAD 7 : Generalized Anxiety Score  Nervous, Anxious, on Edge 0  3 3  Control/stop worrying 0 0 3 3  Worry too much - different things 0 0 3 3  Trouble relaxing 0 0 1 1  Restless 0 0 0 2  Easily annoyed or irritable 2 2 3 3   Afraid - awful might happen 0 0 2 3  Total GAD 7 Score 2  15 18   Anxiety Difficulty Not difficult at all Not difficult at all Somewhat difficult Very difficult   No results found for any visits on 11/14/23.    Assessment & Plan:  Acute vaginitis -     Cervicovaginal ancillary only  Routine screening for STI  (sexually transmitted infection) -     Cervicovaginal ancillary only -     RPR -     HIV Antibody (routine testing w rflx)  Aptima self swab obtained.  Screening for STIs done.  Patient encouraged to take Diflucan now while awaiting results.  Further recommendations based on results.  Return if symptoms worsen or fail to improve.   Deeann Saint, MD

## 2023-11-15 LAB — CERVICOVAGINAL ANCILLARY ONLY
Bacterial Vaginitis (gardnerella): POSITIVE — AB
Candida Glabrata: NEGATIVE
Candida Vaginitis: POSITIVE — AB
Chlamydia: POSITIVE — AB
Comment: NEGATIVE
Comment: NEGATIVE
Comment: NEGATIVE
Comment: NEGATIVE
Comment: NEGATIVE
Comment: NORMAL
Neisseria Gonorrhea: NEGATIVE
Trichomonas: NEGATIVE

## 2023-11-15 LAB — RPR: RPR Ser Ql: NONREACTIVE

## 2023-11-15 LAB — HIV ANTIBODY (ROUTINE TESTING W REFLEX): HIV 1&2 Ab, 4th Generation: NONREACTIVE

## 2023-11-16 ENCOUNTER — Encounter: Payer: Self-pay | Admitting: Family Medicine

## 2023-11-16 ENCOUNTER — Other Ambulatory Visit: Payer: Self-pay | Admitting: Family Medicine

## 2023-11-16 ENCOUNTER — Ambulatory Visit: Admitting: Family Medicine

## 2023-11-16 DIAGNOSIS — N760 Acute vaginitis: Secondary | ICD-10-CM

## 2023-11-16 DIAGNOSIS — B9689 Other specified bacterial agents as the cause of diseases classified elsewhere: Secondary | ICD-10-CM

## 2023-11-16 DIAGNOSIS — A749 Chlamydial infection, unspecified: Secondary | ICD-10-CM

## 2023-11-16 DIAGNOSIS — B3731 Acute candidiasis of vulva and vagina: Secondary | ICD-10-CM

## 2023-11-16 MED ORDER — DOXYCYCLINE HYCLATE 100 MG PO TABS
100.0000 mg | ORAL_TABLET | Freq: Two times a day (BID) | ORAL | 0 refills | Status: AC
Start: 2023-11-16 — End: 2023-11-23

## 2023-11-16 MED ORDER — FLUCONAZOLE 150 MG PO TABS
150.0000 mg | ORAL_TABLET | Freq: Once | ORAL | 0 refills | Status: AC
Start: 2023-11-16 — End: 2023-11-16

## 2023-11-16 MED ORDER — METRONIDAZOLE 500 MG PO TABS
500.0000 mg | ORAL_TABLET | Freq: Two times a day (BID) | ORAL | 0 refills | Status: AC
Start: 2023-11-16 — End: 2023-11-23

## 2023-11-30 ENCOUNTER — Ambulatory Visit: Payer: Self-pay | Admitting: Family Medicine

## 2023-11-30 NOTE — Telephone Encounter (Signed)
  Chief Complaint: lower back pain  Symptoms: pain and bloating   Disposition: [] ED /[] Urgent Care (no appt availability in office) / [x] Appointment(In office/virtual)/ []  [facility]/ [] Home Care/ [] Refused Recommended Disposition /[] [facility]/ []  Follow-up with PCP Additional Notes: Pt calling with complaints of lingering lower back pain an bloating. Pt tested positive for Chlamydia and finished antibiotic a few days ago.  Pt states the lower back pain and an bloating won't go away and feels the same before starting medication. Pt rates pain 6/10. Pt has appt tomorrow morning @ 0840. RN gave care advice and pt verbalized understanding.             Copied from CRM [id number]. Topic: Clinical - Red Word Triage >> Nov 30, 2023  4:00 PM Eunice Blase wrote: Red Word that prompted transfer to Nurse Triage: Pt called having abdominal pain after taking doxycycline (VIBRA-TABS) 100 MG tablet. Reason for Disposition  [1] MODERATE back pain (e.g., interferes with normal activities) AND [2] present > 3 days  Answer Assessment - Initial Assessment Questions 1. ONSET: "When did the pain begin?"      Not sure 2. LOCATION: "Where does it hurt?" (upper, mid or lower back)     Lower back  3. SEVERITY: "How bad is the pain?"  (e.g., Scale 1-10; mild, moderate, or severe)   - MILD (1-3): Doesn't interfere with normal activities.    - MODERATE (4-7): Interferes with normal activities or awakens from sleep.    - SEVERE (8-10): Excruciating pain, unable to do any normal activities.      6 4. PATTERN: "Is the pain constant?" (e.g., yes, no; constant, intermittent)      Comes and goes  5. RADIATION: "Does the pain shoot into your legs or somewhere else?"     no 6. CAUSE:  "What do you think is causing the back pain?"      Chlamydia  7. BACK OVERUSE:  "Any recent lifting of heavy objects, strenuous work or exercise?"     na 8. MEDICINES: "What have you taken so far for the pain?"  (e.g., nothing, acetaminophen, NSAIDS)     No  9. NEUROLOGIC SYMPTOMS: "Do you have any weakness, numbness, or problems with bowel/bladder control?"     Na  10. OTHER SYMPTOMS: "Do you have any other symptoms?" (e.g., fever, abdomen pain, burning with urination, blood in urine)      Bloating;  Protocols used: Back Pain-A-AH

## 2023-12-01 ENCOUNTER — Ambulatory Visit: Admitting: Family Medicine

## 2023-12-01 ENCOUNTER — Ambulatory Visit

## 2023-12-02 ENCOUNTER — Encounter: Payer: Self-pay | Admitting: Family Medicine

## 2023-12-02 ENCOUNTER — Ambulatory Visit: Admitting: Family Medicine

## 2023-12-02 ENCOUNTER — Other Ambulatory Visit (HOSPITAL_COMMUNITY)
Admission: RE | Admit: 2023-12-02 | Discharge: 2023-12-02 | Disposition: A | Discharge status: Home / Self Care | Source: Ambulatory Visit | Attending: Family Medicine | Admitting: Family Medicine

## 2023-12-02 VITALS — BP 100/74 | HR 76 | Temp 98.2°F | Ht 59.0 in | Wt 129.0 lb

## 2023-12-02 DIAGNOSIS — N3001 Acute cystitis with hematuria: Secondary | ICD-10-CM

## 2023-12-02 DIAGNOSIS — Z202 Contact with and (suspected) exposure to infections with a predominantly sexual mode of transmission: Secondary | ICD-10-CM

## 2023-12-02 DIAGNOSIS — N760 Acute vaginitis: Secondary | ICD-10-CM | POA: Diagnosis not present

## 2023-12-02 DIAGNOSIS — N390 Urinary tract infection, site not specified: Secondary | ICD-10-CM

## 2023-12-02 DIAGNOSIS — B379 Candidiasis, unspecified: Secondary | ICD-10-CM

## 2023-12-02 DIAGNOSIS — B9689 Other specified bacterial agents as the cause of diseases classified elsewhere: Secondary | ICD-10-CM

## 2023-12-02 DIAGNOSIS — M545 Low back pain, unspecified: Secondary | ICD-10-CM | POA: Diagnosis not present

## 2023-12-02 LAB — POCT URINALYSIS DIPSTICK
Bilirubin, UA: NEGATIVE
Blood, UA: POSITIVE
Glucose, UA: NEGATIVE
Ketones, UA: NEGATIVE
Nitrite, UA: NEGATIVE
Protein, UA: NEGATIVE
Spec Grav, UA: 1.02 (ref 1.010–1.025)
Urobilinogen, UA: 0.2 U/dL
pH, UA: 6 (ref 5.0–8.0)

## 2023-12-02 MED ORDER — NITROFURANTOIN MONOHYD MACRO 100 MG PO CAPS: 100.0000 mg | ORAL_CAPSULE | Freq: Two times a day (BID) | ORAL | 0 refills | Status: AC

## 2023-12-02 NOTE — Patient Instructions (Addendum)
-  Urinalysis shows signs of urinary tract infection.  -Prescribed Macrobid 100mg  tablet, take 1 tablet twice a day for 5 days.  -Sent urine for culture.  -Self vaginal swab obtained for STDs, bacterial vaginosis, and yeast.  -Office will call with results.

## 2023-12-02 NOTE — Progress Notes (Signed)
 Acute Office Visit   Subjective:  Patient ID: Sharon Mcpherson, female    DOB: 09-13-2000, 23 y.o.   MRN: [id number]  Chief Complaint  Patient presents with   Back Pain   Abdominal Pain    HPI:  Patient is having lower back pain and lower abd pain that started about a week ago. Also, experiencing increase frequency urinating. She was seen on 03/10 by Dr. Abbe Amsterdam, PCP, for vaginal discharge. She was positive for chlamydia, BV, and yeast. She was prescribed Doxycycline for chlamydia, Flagyl for BV, and Diflucan for yeast infection.   Denies fever, nausea, vomiting, or  hematuria.  Review of Systems  Gastrointestinal:  Positive for abdominal pain.  Musculoskeletal:  Positive for back pain.   See HPI above      Objective:   BP 100/74   Pulse 76   Temp 98.2 F (36.8 C) (Oral)   Ht 4\' 11"  (1.499 m)   Wt 129 lb (58.5 kg)   LMP 10/30/2023 (Exact Date)   SpO2 98%   BMI 26.05 kg/m    Physical Exam Vitals reviewed.  Constitutional:      General: She is not in acute distress.    Appearance: Normal appearance. She is not ill-appearing, toxic-appearing or diaphoretic.  HENT:     Head: Normocephalic and atraumatic.  Eyes:     General:        Right eye: No discharge.        Left eye: No discharge.     Conjunctiva/sclera: Conjunctivae normal.  Cardiovascular:     Rate and Rhythm: Normal rate.  Pulmonary:     Effort: Pulmonary effort is normal. No respiratory distress.  Musculoskeletal:        General: Normal range of motion.  Skin:    General: Skin is warm and dry.  Neurological:     General: No focal deficit present.     Mental Status: She is alert and oriented to person, place, and time. Mental status is at baseline.  Psychiatric:        Mood and Affect: Mood normal.        Behavior: Behavior normal.        Thought Content: Thought content normal.        Judgment: Judgment normal.     Results for orders placed or performed in visit on 12/02/23   POC Urinalysis Dipstick  Result Value Ref Range   Color, UA dark    Clarity, UA cloudy    Glucose, UA Negative Negative   Bilirubin, UA neg    Ketones, UA neg    Spec Grav, UA 1.020 1.010 - 1.025   Blood, UA pos    pH, UA 6.0 5.0 - 8.0   Protein, UA Negative Negative   Urobilinogen, UA 0.2 0.2 or 1.0 E.U./dL   Nitrite, UA neg    Leukocytes, UA Moderate (2+) (A) Negative   Appearance     Odor          Assessment & Plan:  Low back pain, unspecified back pain laterality, unspecified chronicity, unspecified whether sciatica present -     POCT urinalysis dipstick  Acute cystitis with hematuria -     Nitrofurantoin Monohyd Macro; Take 1 capsule (100 mg total) by mouth 2 (two) times daily for 5 days.  Dispense: 10 capsule; Refill: 0 -     Urine Culture  Encounter for assessment of STD exposure -     Cervicovaginal ancillary only  -Urinalysis positive  for leukocytes and blood. Diagnosed UTI. Prescribed Macrobid 100mg  tablet, BID for 5 days. Sending urine for culture.  -Aptima self swab completed today for STDs at patients request. She wants to make sure the yeast, chlamydia, and BV are resolved.    Zandra Abts, NP

## 2023-12-03 LAB — URINE CULTURE
MICRO NUMBER:: 16261087
Result:: NO GROWTH
SPECIMEN QUALITY:: ADEQUATE

## 2023-12-05 ENCOUNTER — Encounter: Payer: Self-pay | Admitting: Family Medicine

## 2023-12-05 LAB — CERVICOVAGINAL ANCILLARY ONLY
Bacterial Vaginitis (gardnerella): POSITIVE — AB
Candida Glabrata: NEGATIVE
Candida Vaginitis: POSITIVE — AB
Chlamydia: NEGATIVE
Comment: NEGATIVE
Comment: NEGATIVE
Comment: NEGATIVE
Comment: NEGATIVE
Comment: NEGATIVE
Comment: NORMAL
Neisseria Gonorrhea: NEGATIVE
Trichomonas: NEGATIVE

## 2023-12-06 MED ORDER — FLUCONAZOLE 150 MG PO TABS
150.0000 mg | ORAL_TABLET | Freq: Every day | ORAL | 0 refills | Status: DC
Start: 2023-12-06 — End: 2023-12-06

## 2023-12-06 MED ORDER — FLUCONAZOLE 150 MG PO TABS
ORAL_TABLET | ORAL | 0 refills | Status: DC
Start: 2023-12-06 — End: 2024-03-25

## 2023-12-06 MED ORDER — METRONIDAZOLE 500 MG PO TABS
500.0000 mg | ORAL_TABLET | Freq: Two times a day (BID) | ORAL | 0 refills | Status: AC
Start: 2023-12-06 — End: 2023-12-13

## 2023-12-06 NOTE — Addendum Note (Signed)
 Addended by: Alveria Apley on: 12/06/2023 08:45 AM   Modules accepted: Orders

## 2023-12-06 NOTE — Addendum Note (Signed)
 Addended by: Kenna Gilbert B on: 12/06/2023 07:36 AM   Modules accepted: Orders

## 2024-01-26 ENCOUNTER — Ambulatory Visit

## 2024-01-26 ENCOUNTER — Other Ambulatory Visit (HOSPITAL_COMMUNITY)
Admission: RE | Admit: 2024-01-26 | Discharge: 2024-01-26 | Disposition: A | Discharge status: Home / Self Care | Source: Ambulatory Visit | Attending: Obstetrics and Gynecology | Admitting: Obstetrics and Gynecology

## 2024-01-26 VITALS — BP 135/85 | HR 60 | Wt 139.3 lb

## 2024-01-26 DIAGNOSIS — N898 Other specified noninflammatory disorders of vagina: Secondary | ICD-10-CM | POA: Insufficient documentation

## 2024-01-26 DIAGNOSIS — Z113 Encounter for screening for infections with a predominantly sexual mode of transmission: Secondary | ICD-10-CM

## 2024-01-26 DIAGNOSIS — M545 Low back pain, unspecified: Secondary | ICD-10-CM | POA: Diagnosis not present

## 2024-01-26 LAB — POCT URINALYSIS DIPSTICK
Bilirubin, UA: NEGATIVE
Blood, UA: NEGATIVE
Glucose, UA: NEGATIVE
Ketones, UA: NEGATIVE
Nitrite, UA: NEGATIVE
Odor: NEGATIVE
Protein, UA: NEGATIVE
Spec Grav, UA: 1.01 (ref 1.010–1.025)
Urobilinogen, UA: 0.2 U/dL
pH, UA: 6 (ref 5.0–8.0)

## 2024-01-26 NOTE — Progress Notes (Signed)
..  SUBJECTIVE:  23 y.o. female complains  low back pain, mostly on left side x 1 week. Pt is concerned about possible uti. Pt would like STD testing today Denies abnormal vaginal bleeding or significant pelvic pain or fever.  Denies history of known exposure to STD.  Patient's last menstrual period was 01/11/2024.  OBJECTIVE:  She appears well, afebrile. Urine dipstick: positive for leukocytes.  ASSESSMENT:  Vaginal Discharge  Vaginal Odor   PLAN:  GC, chlamydia, trichomonas, BVAG, CVAG probe sent to lab. Treatment: To be determined once lab results are received ROV prn if symptoms persist or worsen.

## 2024-01-27 LAB — CERVICOVAGINAL ANCILLARY ONLY
Bacterial Vaginitis (gardnerella): NEGATIVE
Candida Glabrata: NEGATIVE
Candida Vaginitis: NEGATIVE
Chlamydia: POSITIVE — AB
Comment: NEGATIVE
Comment: NEGATIVE
Comment: NEGATIVE
Comment: NEGATIVE
Comment: NEGATIVE
Comment: NORMAL
Neisseria Gonorrhea: POSITIVE — AB
Trichomonas: NEGATIVE

## 2024-01-28 LAB — URINE CULTURE

## 2024-01-31 ENCOUNTER — Ambulatory Visit: Payer: Self-pay | Admitting: Obstetrics and Gynecology

## 2024-01-31 DIAGNOSIS — A749 Chlamydial infection, unspecified: Secondary | ICD-10-CM

## 2024-01-31 MED ORDER — DOXYCYCLINE HYCLATE 100 MG PO CAPS: 100.0000 mg | ORAL_CAPSULE | Freq: Two times a day (BID) | ORAL | 0 refills | Status: AC

## 2024-02-01 ENCOUNTER — Ambulatory Visit

## 2024-02-01 VITALS — BP 133/77 | HR 79

## 2024-02-01 DIAGNOSIS — A549 Gonococcal infection, unspecified: Secondary | ICD-10-CM

## 2024-02-01 MED ORDER — CEFTRIAXONE SODIUM 500 MG IJ SOLR
500.0000 mg | Freq: Once | INTRAMUSCULAR | Status: AC
  Administered 2024-02-01: 500 mg via INTRAMUSCULAR

## 2024-02-01 NOTE — Progress Notes (Signed)
 Pt presents for Rocephin injection. Pt unable to do vaccine in buttocks, and request to only do arm. Vaccine administered in L Deltoid, pt tolerated well. Aware to schedule TOC.

## 2024-02-03 ENCOUNTER — Ambulatory Visit: Admitting: Family Medicine

## 2024-02-06 ENCOUNTER — Ambulatory Visit

## 2024-02-13 ENCOUNTER — Ambulatory Visit: Admitting: Family Medicine

## 2024-02-14 ENCOUNTER — Other Ambulatory Visit: Payer: Self-pay

## 2024-02-14 MED ORDER — FLUCONAZOLE 150 MG PO TABS: 150.0000 mg | ORAL_TABLET | Freq: Once | ORAL | 0 refills | Status: AC

## 2024-02-20 ENCOUNTER — Encounter: Payer: Self-pay | Admitting: Family Medicine

## 2024-02-20 ENCOUNTER — Other Ambulatory Visit (HOSPITAL_COMMUNITY)
Admission: RE | Admit: 2024-02-20 | Discharge: 2024-02-20 | Disposition: A | Discharge status: Home / Self Care | Source: Ambulatory Visit | Attending: Family Medicine | Admitting: Family Medicine

## 2024-02-20 ENCOUNTER — Ambulatory Visit: Admitting: Family Medicine

## 2024-02-20 VITALS — BP 98/70 | HR 65 | Temp 96.6°F | Ht 59.0 in | Wt 130.4 lb

## 2024-02-20 DIAGNOSIS — N898 Other specified noninflammatory disorders of vagina: Secondary | ICD-10-CM

## 2024-02-20 DIAGNOSIS — R102 Pelvic and perineal pain: Secondary | ICD-10-CM

## 2024-02-20 LAB — POCT URINALYSIS DIPSTICK
Bilirubin, UA: NEGATIVE
Blood, UA: NEGATIVE
Glucose, UA: NEGATIVE
Ketones, UA: NEGATIVE
Leukocytes, UA: NEGATIVE
Nitrite, UA: NEGATIVE
Protein, UA: NEGATIVE
Spec Grav, UA: 1.01 (ref 1.010–1.025)
Urobilinogen, UA: 0.2 U/dL
pH, UA: 6 (ref 5.0–8.0)

## 2024-02-20 NOTE — Progress Notes (Signed)
 Established Patient Office Visit   Subjective  Patient ID: Sharon Mcpherson, female    DOB: 12-26-2000  Age: 23 y.o. MRN: [id number]  Chief Complaint  Patient presents with   Medical Management of Chronic Issues    Patient would like to get tested for ureaplasma and mycoplasma dur to recurring BV     Patient is a 23 year old female seen for follow-up.  Pt started treatment for possible yeast infection last week.  Medication was called in by OB/GYN.  Patient states she may have another medication, an antibiotic? at the pharmacy that she has yet to pick up.  Continue to have some whitish discharge with irritation and intermittent suprapubic pressure.  Denies nausea, vomiting, fever, chills, back pain.  Typically drinks several bottles of water daily.  Pt also notes a history of BV.  Inquires if could be due to Ureaplasma or mycoplasma infection after seeing information on TikTok.    Patient Active Problem List   Diagnosis Date Noted   Contraception management 06/24/2021   MDD (major depressive disorder) 08/04/2020   Migraine 08/04/2020   Migraine without aura and without status migrainosus, not intractable 11/21/2018   Tension headache 11/21/2018   Anxiety state 11/21/2018   Sleeping difficulty 11/21/2018   Past Medical History:  Diagnosis Date   Asthma    Past Surgical History:  Procedure Laterality Date   NO PAST SURGERIES     Social History   Tobacco Use   Smoking status: Never   Smokeless tobacco: Never  Vaping Use   Vaping status: Never Used  Substance Use Topics   Alcohol use: Yes   Drug use: Yes    Frequency: 2.0 times per week    Types: Marijuana    Comment: weekly   Family History  Problem Relation Age of Onset   Hyperlipidemia Mother    Healthy Father    Migraines Neg Hx    Seizures Neg Hx    Autism Neg Hx    ADD / ADHD Neg Hx    Anxiety disorder Neg Hx    Depression Neg Hx    Bipolar disorder Neg Hx    Schizophrenia Neg Hx    No  Known Allergies  ROS Negative unless stated above    Objective:     BP 98/70 (BP Location: Left Arm, Patient Position: Sitting, Cuff Size: Normal)   Pulse 65   Temp (!) 96.6 F (35.9 C) (Oral)   Ht 4' 11 (1.499 m)   Wt 130 lb 6.4 oz (59.1 kg)   LMP 02/05/2024 (Exact Date)   SpO2 98%   BMI 26.34 kg/m  BP Readings from Last 3 Encounters:  02/20/24 98/70  02/01/24 133/77  01/26/24 135/85   Wt Readings from Last 3 Encounters:  02/20/24 130 lb 6.4 oz (59.1 kg)  01/26/24 139 lb 4.8 oz (63.2 kg)  12/02/23 129 lb (58.5 kg)      Physical Exam Constitutional:      General: She is not in acute distress.    Appearance: Normal appearance.  HENT:     Head: Normocephalic and atraumatic.     Nose: Nose normal.     Mouth/Throat:     Mouth: Mucous membranes are moist.   Cardiovascular:     Rate and Rhythm: Normal rate and regular rhythm.     Heart sounds: Normal heart sounds. No murmur heard.    No gallop.  Pulmonary:     Effort: Pulmonary effort is normal. No respiratory distress.  Breath sounds: Normal breath sounds. No wheezing, rhonchi or rales.  Abdominal:     Palpations: Abdomen is soft.     Tenderness: There is no abdominal tenderness. There is no right CVA tenderness, left CVA tenderness, guarding or rebound.  Genitourinary:    Comments: Aptima self swab collected.  Skin:    General: Skin is warm and dry.   Neurological:     Mental Status: She is alert and oriented to person, place, and time.        02/20/2024   11:14 AM 11/14/2023    2:22 PM 06/08/2023    8:52 AM  Depression screen PHQ 2/9  Decreased Interest 1 1 0  Down, Depressed, Hopeless 0 0 0  PHQ - 2 Score 1 1 0  Altered sleeping 1 2 0  Tired, decreased energy 2 2   Change in appetite 2 1 0  Feeling bad or failure about yourself  0 0 0  Trouble concentrating 1 0 1  Moving slowly or fidgety/restless 0 0 0  Suicidal thoughts 0  0  PHQ-9 Score 7 6 1   Difficult doing work/chores Not difficult  at all Not difficult at all Not difficult at all      02/20/2024   11:15 AM 11/14/2023    2:23 PM 06/08/2023    8:52 AM 04/18/2023   11:03 AM  GAD 7 : Generalized Anxiety Score  Nervous, Anxious, on Edge 0 0  3  Control/stop worrying 0 0 0 3  Worry too much - different things 0 0 0 3  Trouble relaxing 0 0 0 1  Restless 0 0 0 0  Easily annoyed or irritable 0 2 2 3   Afraid - awful might happen 0 0 0 2  Total GAD 7 Score 0 2  15  Anxiety Difficulty Not difficult at all Not difficult at all Not difficult at all Somewhat difficult     Results for orders placed or performed in visit on 02/20/24  POCT urinalysis dipstick  Result Value Ref Range   Color, UA yellow    Clarity, UA clear    Glucose, UA Negative Negative   Bilirubin, UA neg    Ketones, UA neg    Spec Grav, UA 1.010 1.010 - 1.025   Blood, UA neg    pH, UA 6.0 5.0 - 8.0   Protein, UA Negative Negative   Urobilinogen, UA 0.2 0.2 or 1.0 E.U./dL   Nitrite, UA neg    Leukocytes, UA Negative Negative   Appearance     Odor        Assessment & Plan:   Suprapubic pressure -     POCT urinalysis dipstick -     Cervicovaginal ancillary only  Vaginal discharge -     Cervicovaginal ancillary only  Acute urinary/vaginitis symptoms after treatment of yeast.  POC UA negative for UTI.  Aptima self swab collected.  Further recommendations based on results.  Education provided.  Return if symptoms worsen or fail to improve.   Viola Greulich, MD

## 2024-02-21 LAB — CERVICOVAGINAL ANCILLARY ONLY
Bacterial Vaginitis (gardnerella): NEGATIVE
Candida Glabrata: NEGATIVE
Candida Vaginitis: NEGATIVE
Chlamydia: NEGATIVE
Comment: NEGATIVE
Comment: NEGATIVE
Comment: NEGATIVE
Comment: NEGATIVE
Comment: NEGATIVE
Comment: NORMAL
Neisseria Gonorrhea: NEGATIVE
Trichomonas: NEGATIVE

## 2024-02-22 ENCOUNTER — Ambulatory Visit: Payer: Self-pay | Admitting: Family Medicine

## 2024-03-25 ENCOUNTER — Ambulatory Visit (HOSPITAL_COMMUNITY)
Admission: RE | Admit: 2024-03-25 | Discharge: 2024-03-25 | Disposition: A | Discharge status: Home / Self Care | Payer: Self-pay | Source: Ambulatory Visit

## 2024-03-25 ENCOUNTER — Encounter (HOSPITAL_COMMUNITY): Payer: Self-pay

## 2024-03-25 VITALS — BP 123/83 | HR 62 | Temp 98.3°F | Resp 18 | Ht 59.0 in | Wt 132.0 lb

## 2024-03-25 DIAGNOSIS — Z113 Encounter for screening for infections with a predominantly sexual mode of transmission: Secondary | ICD-10-CM

## 2024-03-25 DIAGNOSIS — Z3202 Encounter for pregnancy test, result negative: Secondary | ICD-10-CM

## 2024-03-25 DIAGNOSIS — R300 Dysuria: Secondary | ICD-10-CM

## 2024-03-25 LAB — POCT URINALYSIS DIP (MANUAL ENTRY)
Bilirubin, UA: NEGATIVE
Glucose, UA: NEGATIVE mg/dL
Ketones, POC UA: NEGATIVE mg/dL
Leukocytes, UA: NEGATIVE
Nitrite, UA: NEGATIVE
Protein Ur, POC: NEGATIVE mg/dL
Spec Grav, UA: 1.015 (ref 1.010–1.025)
Urobilinogen, UA: 1 U/dL
pH, UA: 6 (ref 5.0–8.0)

## 2024-03-25 LAB — POCT URINE PREGNANCY: Preg Test, Ur: NEGATIVE

## 2024-03-25 NOTE — Discharge Instructions (Signed)
  1. Dysuria (Primary) 2. Screening for STD (sexually transmitted disease) - POC urinalysis dipstick completed in UC shows trace blood, no leukocytes, no nitrite, no sign of urinary tract infection - POCT urine pregnancy completed UC is negative - Cervicovaginal swab collected in UC and sent to lab for further testing results should be available in 2 to 3 days - Urine Culture collected in UC and sent to lab for further testing results should be available in 2 to 3 days - Ambulatory referral to Urogynecology for follow-up and evaluation of ongoing suprapubic abdominal pressure and recurrent vaginal infections. -Continue to monitor symptoms for any change in severity if there is any escalation of current symptoms or development of new symptoms follow-up in ER for further evaluation and management.

## 2024-03-25 NOTE — ED Triage Notes (Signed)
 Patient presenting with lower abdominal pain and inflammation onset few weeks ago. Sates she has been seen and cleared for a uti but continues to have bloating and dysuria.  Prescriptions or OTC medications tried: Yes- Metronidazole , Diflucan     with no relief

## 2024-03-25 NOTE — ED Provider Notes (Signed)
 [facility]  Note:  This document was prepared using Dragon voice recognition software and may include unintentional dictation errors.  MRN: [id number] DOB: August 09, 2001  Subjective:   Sharon Mcpherson is a 23 y.o. female presenting for ongoing lower abdominal/suprapubic pain, bloating, inflammation x 2 to 3 weeks.  Patient reports that she was previously seen for UTI but was told there was no evidence of urinary tract infection with urinary testing.  Patient reports recent positive testing for chlamydia and gonorrhea as well as BV and yeast infection both treated, however she is still having burning sensation in her bladder and mild bloating.  Patient requesting STD screening as well as follow-up evaluation with gynecology.  Patient states that she has asked her primary care provider for a abdominal ultrasound to evaluate the cause but is told that it is nothing to worry about and is never referred for ultrasound.  No current facility-administered medications for this encounter. No current outpatient medications on file.   No Known Allergies  Past Medical History:  Diagnosis Date   Asthma      Past Surgical History:  Procedure Laterality Date   NO PAST SURGERIES      Family History  Problem Relation Age of Onset   Hyperlipidemia Mother    Healthy Father    Migraines Neg Hx    Seizures Neg Hx    Autism Neg Hx    ADD / ADHD Neg Hx    Anxiety disorder Neg Hx    Depression Neg Hx    Bipolar disorder Neg Hx    Schizophrenia Neg Hx     Social History   Tobacco Use   Smoking status: Never   Smokeless tobacco: Never  Vaping Use   Vaping status: Never Used  Substance Use Topics   Alcohol use: Yes   Drug use: Yes    Frequency: 2.0 times per week    Types: Marijuana    Comment: weekly    ROS Refer to HPI for ROS details.  Objective:   Vitals: BP 123/83 (BP Location: Left Arm)   Pulse 62   Temp 98.3 F (36.8 C) (Oral)   Resp 18   Ht 4'  11 (1.499 m)   Wt 132 lb (59.9 kg)   LMP 03/02/2024 (Exact Date)   SpO2 98%   BMI 26.66 kg/m   Physical Exam Vitals and nursing note reviewed.  Constitutional:      General: She is not in acute distress.    Appearance: She is well-developed. She is not ill-appearing or toxic-appearing.  HENT:     Head: Normocephalic and atraumatic.  Cardiovascular:     Rate and Rhythm: Normal rate.  Pulmonary:     Effort: Pulmonary effort is normal. No respiratory distress.  Abdominal:     General: There is no distension.     Palpations: Abdomen is soft.     Tenderness: There is abdominal tenderness (Mild suprapubic abdominal pressure with palpation.). There is no right CVA tenderness or left CVA tenderness.  Skin:    General: Skin is warm and dry.  Neurological:     General: No focal deficit present.     Mental Status: She is alert and oriented to person, place, and time.  Psychiatric:        Mood and Affect: Mood normal.        Behavior: Behavior normal.     Procedures  Results for orders placed or performed during the hospital encounter of 03/25/24 (from the  past 24 hours)  POCT urine pregnancy     Status: None   Collection Time: 03/25/24  1:05 PM  Result Value Ref Range   Preg Test, Ur Negative Negative  POC urinalysis dipstick     Status: Abnormal   Collection Time: 03/25/24  1:06 PM  Result Value Ref Range   Color, UA yellow yellow   Clarity, UA clear clear   Glucose, UA negative negative mg/dL   Bilirubin, UA negative negative   Ketones, POC UA negative negative mg/dL   Spec Grav, UA 8.984 8.989 - 1.025   Blood, UA trace-intact (A) negative   pH, UA 6.0 5.0 - 8.0   Protein Ur, POC negative negative mg/dL   Urobilinogen, UA 1.0 0.2 or 1.0 E.U./dL   Nitrite, UA Negative Negative   Leukocytes, UA Negative Negative    No results found.   Assessment and Plan :     Discharge Instructions       1. Dysuria (Primary) 2. Screening for STD (sexually transmitted  disease) - POC urinalysis dipstick completed in UC shows trace blood, no leukocytes, no nitrite, no sign of urinary tract infection - POCT urine pregnancy completed UC is negative - Cervicovaginal swab collected in UC and sent to lab for further testing results should be available in 2 to 3 days - Urine Culture collected in UC and sent to lab for further testing results should be available in 2 to 3 days - Ambulatory referral to Urogynecology for follow-up and evaluation of ongoing suprapubic abdominal pressure and recurrent vaginal infections. -Continue to monitor symptoms for any change in severity if there is any escalation of current symptoms or development of new symptoms follow-up in ER for further evaluation and management.      Eduar Kumpf B Arvo Ealy   Lizvet Chunn, [place] B, [place] 03/25/24 1409

## 2024-03-26 LAB — CERVICOVAGINAL ANCILLARY ONLY
Bacterial Vaginitis (gardnerella): NEGATIVE
Candida Glabrata: NEGATIVE
Candida Vaginitis: NEGATIVE
Chlamydia: NEGATIVE
Comment: NEGATIVE
Comment: NEGATIVE
Comment: NEGATIVE
Comment: NEGATIVE
Comment: NEGATIVE
Comment: NORMAL
Neisseria Gonorrhea: NEGATIVE
Trichomonas: NEGATIVE

## 2024-03-27 ENCOUNTER — Ambulatory Visit (HOSPITAL_COMMUNITY): Payer: Self-pay

## 2024-03-27 LAB — URINE CULTURE
Culture: 10000 — AB
Special Requests: NORMAL

## 2024-03-27 MED ORDER — NITROFURANTOIN MONOHYD MACRO 100 MG PO CAPS: 100.0000 mg | ORAL_CAPSULE | Freq: Two times a day (BID) | ORAL | 0 refills | Status: AC

## 2024-06-07 ENCOUNTER — Other Ambulatory Visit (HOSPITAL_COMMUNITY)
Admission: RE | Admit: 2024-06-07 | Discharge: 2024-06-07 | Disposition: A | Discharge status: Home / Self Care | Source: Ambulatory Visit | Attending: Family Medicine | Admitting: Family Medicine

## 2024-06-07 ENCOUNTER — Encounter: Payer: Self-pay | Admitting: Family Medicine

## 2024-06-07 ENCOUNTER — Ambulatory Visit (INDEPENDENT_AMBULATORY_CARE_PROVIDER_SITE_OTHER): Admitting: Family Medicine

## 2024-06-07 VITALS — BP 106/68 | HR 105 | Temp 98.6°F | Ht 59.0 in | Wt 136.8 lb

## 2024-06-07 DIAGNOSIS — R300 Dysuria: Secondary | ICD-10-CM

## 2024-06-07 DIAGNOSIS — Z124 Encounter for screening for malignant neoplasm of cervix: Secondary | ICD-10-CM | POA: Insufficient documentation

## 2024-06-07 DIAGNOSIS — R3129 Other microscopic hematuria: Secondary | ICD-10-CM | POA: Diagnosis not present

## 2024-06-07 DIAGNOSIS — Z113 Encounter for screening for infections with a predominantly sexual mode of transmission: Secondary | ICD-10-CM

## 2024-06-07 DIAGNOSIS — Z0000 Encounter for general adult medical examination without abnormal findings: Secondary | ICD-10-CM

## 2024-06-07 LAB — POC URINALSYSI DIPSTICK (AUTOMATED)
Bilirubin, UA: NEGATIVE
Blood, UA: POSITIVE
Glucose, UA: NEGATIVE
Ketones, UA: NEGATIVE
Leukocytes, UA: NEGATIVE
Nitrite, UA: NEGATIVE
Protein, UA: NEGATIVE
Spec Grav, UA: 1.015 (ref 1.010–1.025)
Urobilinogen, UA: 0.2 U/dL
pH, UA: 6.5 (ref 5.0–8.0)

## 2024-06-07 NOTE — Progress Notes (Signed)
 Established Patient Office Visit   Subjective  Patient ID: Sharon Mcpherson Sharon Mcpherson, female    DOB: Jun 12, 2001  Age: 23 y.o. MRN: [id number]  Chief Complaint  Patient presents with   Annual Exam    Pt is a 23 yo female seen for CPE. Pt states she is doing well and staying busy with work.  Concerned for UTI due to lower abd bloating.  Denies frequency, urgency, hematuria, constipation, n/v, fever, chills, vaginal d/c.  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  Has an appt with Urogyn at the end of the month for h/o frequent UTIs.    Patient Active Problem List   Diagnosis Date Noted   Contraception management 06/24/2021   MDD (major depressive disorder) 08/04/2020   Migraine 08/04/2020   Migraine without aura and without status migrainosus, not intractable 11/21/2018   Tension headache 11/21/2018   Anxiety state 11/21/2018   Sleeping difficulty 11/21/2018   Past Medical History:  Diagnosis Date   Asthma    Past Surgical History:  Procedure Laterality Date   NO PAST SURGERIES     Social History   Tobacco Use   Smoking status: Never   Smokeless tobacco: Never  Vaping Use   Vaping status: Never Used  Substance Use Topics   Alcohol use: Yes   Drug use: Yes    Frequency: 2.0 times per week    Types: Marijuana    Comment: weekly   Family History  Problem Relation Age of Onset   Hyperlipidemia Mother    Healthy Father    Migraines Neg Hx    Seizures Neg Hx    Autism Neg Hx    ADD / ADHD Neg Hx    Anxiety disorder Neg Hx    Depression Neg Hx    Bipolar disorder Neg Hx    Schizophrenia Neg Hx    No Known Allergies  ROS Negative unless stated above    Objective:     BP 106/68 (BP Location: Left Arm, Patient Position: Sitting, Cuff Size: Normal)   Pulse (!) 105   Temp 98.6 F (37 C) (Oral)   Ht 4' 11 (1.499 m)   Wt 136 lb 12.8 oz (62.1 kg)   SpO2 99%   BMI 27.63 kg/m  BP Readings from Last 3 Encounters:   06/07/24 106/68  03/25/24 123/83  02/20/24 98/70   Wt Readings from Last 3 Encounters:  06/07/24 136 lb 12.8 oz (62.1 kg)  03/25/24 132 lb (59.9 kg)  02/20/24 130 lb 6.4 oz (59.1 kg)      Physical Exam Constitutional:      Appearance: Normal appearance.  HENT:     Head: Normocephalic and atraumatic.     Right Ear: Tympanic membrane, ear canal and external ear normal.     Left Ear: Tympanic membrane, ear canal and external ear normal.     Nose: Nose normal.     Mouth/Throat:     Mouth: Mucous membranes are moist.     Pharynx: No oropharyngeal exudate or posterior oropharyngeal erythema.  Eyes:     General: No scleral icterus.    Extraocular Movements: Extraocular movements intact.     Conjunctiva/sclera: Conjunctivae normal.     Pupils: Pupils are equal, round, and reactive to light.  Neck:     Thyroid: No thyromegaly.     Vascular: No carotid bruit.  Cardiovascular:     Rate and Rhythm: Normal rate and regular rhythm.  Pulses: Normal pulses.     Heart sounds: Normal heart sounds. No murmur heard.    No friction rub.  Pulmonary:     Effort: Pulmonary effort is normal.     Breath sounds: Normal breath sounds. No wheezing, rhonchi or rales.  Abdominal:     General: Bowel sounds are normal.     Palpations: Abdomen is soft.     Tenderness: There is no abdominal tenderness.  Genitourinary:    General: Normal vulva.     Labia:        Right: No lesion.        Left: No lesion.      Urethra: No prolapse.     Vagina: Vaginal discharge present.     Cervix: Normal.     Uterus: Normal.      Adnexa: Right adnexa normal and left adnexa normal.     Rectum: Normal.     Comments: Scant whitish d/c in vaginal vault. Musculoskeletal:        General: No deformity. Normal range of motion.  Lymphadenopathy:     Cervical: No cervical adenopathy.  Skin:    General: Skin is warm and dry.     Findings: No lesion.  Neurological:     General: No focal deficit present.     Mental  Status: She is alert and oriented to person, place, and time.  Psychiatric:        Mood and Affect: Mood normal.        Thought Content: Thought content normal.        02/20/2024   11:14 AM 11/14/2023    2:22 PM 06/08/2023    8:52 AM  Depression screen PHQ 2/9  Decreased Interest 1 1 0  Down, Depressed, Hopeless 0 0 0  PHQ - 2 Score 1 1 0  Altered sleeping 1 2 0  Tired, decreased energy 2 2   Change in appetite 2 1 0  Feeling bad or failure about yourself  0 0 0  Trouble concentrating 1 0 1  Moving slowly or fidgety/restless 0 0 0  Suicidal thoughts 0  0  PHQ-9 Score 7 6 1   Difficult doing work/chores Not difficult at all Not difficult at all Not difficult at all      02/20/2024   11:15 AM 11/14/2023    2:23 PM 06/08/2023    8:52 AM 04/18/2023   11:03 AM  GAD 7 : Generalized Anxiety Score  Nervous, Anxious, on Edge 0 0  3  Control/stop worrying 0 0 0 3  Worry too much - different things 0 0 0 3  Trouble relaxing 0 0 0 1  Restless 0 0 0 0  Easily annoyed or irritable 0 2 2 3   Afraid - awful might happen 0 0 0 2  Total GAD 7 Score 0 2  15  Anxiety Difficulty Not difficult at all Not difficult at all Not difficult at all Somewhat difficult     Results for orders placed or performed in visit on 06/07/24  POCT Urinalysis Dipstick (Automated)  Result Value Ref Range   Color, UA yellow    Clarity, UA Clear    Glucose, UA Negative Negative   Bilirubin, UA neg    Ketones, UA neg    Spec Grav, UA 1.015 1.010 - 1.025   Blood, UA pos    pH, UA 6.5 5.0 - 8.0   Protein, UA Negative Negative   Urobilinogen, UA 0.2 0.2 or 1.0 E.U./dL   Nitrite,  UA neg    Leukocytes, UA Negative Negative      Assessment & Plan:   Well adult exam -     CBC with Differential/Platelet; Future -     TSH; Future -     T4, free; Future -     Lipid panel; Future -     Hemoglobin A1c; Future -     Comprehensive metabolic panel with GFR; Future  Dysuria -     POCT Urinalysis Dipstick  (Automated)  Cervical cancer screening -     Cytology - PAP  Other microscopic hematuria -     Urine Culture; Future  Routine screening for STI (sexually transmitted infection) -     HIV Antibody (routine testing w rflx); Future -     RPR; Future  Age appropriate health screening discussed. Obtain labs.  Immunizations reviewed.  Consider influenza vaccine. Pap obtained. POC UA with 1+RBC.  Obtain Ucx.  Advised to keep appt with Urogyn at end of month.    Return if symptoms worsen or fail to improve.   Clotilda JONELLE Single, MD

## 2024-06-08 ENCOUNTER — Encounter: Admitting: Family Medicine

## 2024-06-08 LAB — URINE CULTURE
MICRO NUMBER:: 17047789
Result:: NO GROWTH
SPECIMEN QUALITY:: ADEQUATE

## 2024-06-08 LAB — COMPREHENSIVE METABOLIC PANEL WITH GFR
AG Ratio: 1.4 (calc) (ref 1.0–2.5)
ALT: 68 U/L — ABNORMAL HIGH (ref 6–29)
AST: 49 U/L — ABNORMAL HIGH (ref 10–30)
Albumin: 4.2 g/dL (ref 3.6–5.1)
Alkaline phosphatase (APISO): 53 U/L (ref 31–125)
BUN: 12 mg/dL (ref 7–25)
CO2: 27 mmol/L (ref 20–32)
Calcium: 9.5 mg/dL (ref 8.6–10.2)
Chloride: 104 mmol/L (ref 98–110)
Creat: 0.65 mg/dL (ref 0.50–0.96)
Globulin: 3 g/dL (ref 1.9–3.7)
Glucose, Bld: 82 mg/dL (ref 65–99)
Potassium: 4.1 mmol/L (ref 3.5–5.3)
Sodium: 139 mmol/L (ref 135–146)
Total Bilirubin: 0.4 mg/dL (ref 0.2–1.2)
Total Protein: 7.2 g/dL (ref 6.1–8.1)
eGFR: 128 mL/min/1.73m2 (ref >=60)

## 2024-06-08 LAB — CBC WITH DIFFERENTIAL/PLATELET
Absolute Lymphocytes: 1863 {cells}/uL (ref 850–3900)
Absolute Monocytes: 503 {cells}/uL (ref 200–950)
Basophils Absolute: 20 {cells}/uL (ref 0–200)
Basophils Relative: 0.3 %
Eosinophils Absolute: 27 {cells}/uL (ref 15–500)
Eosinophils Relative: 0.4 %
HCT: 38.3 % (ref 35.0–45.0)
Hemoglobin: 12.1 g/dL (ref 11.7–15.5)
MCH: 28.6 pg (ref 27.0–33.0)
MCHC: 31.6 g/dL — ABNORMAL LOW (ref 32.0–36.0)
MCV: 90.5 fL (ref 80.0–100.0)
MPV: 9.8 fL (ref 7.5–12.5)
Monocytes Relative: 7.4 %
Neutro Abs: 4386 {cells}/uL (ref 1500–7800)
Neutrophils Relative %: 64.5 %
Platelets: 271 Thousand/uL (ref 140–400)
RBC: 4.23 Million/uL (ref 3.80–5.10)
RDW: 12.9 % (ref 11.0–15.0)
Total Lymphocyte: 27.4 %
WBC: 6.8 Thousand/uL (ref 3.8–10.8)

## 2024-06-08 LAB — LIPID PANEL
Cholesterol: 154 mg/dL (ref <200)
HDL: 73 mg/dL (ref >=50)
LDL Cholesterol (Calc): 67 mg/dL
Non-HDL Cholesterol (Calc): 81 mg/dL (ref <130)
Total CHOL/HDL Ratio: 2.1 (calc) (ref <5.0)
Triglycerides: 49 mg/dL (ref <150)

## 2024-06-08 LAB — HEMOGLOBIN A1C
Hgb A1c MFr Bld: 4.7 % (ref <5.7)
Mean Plasma Glucose: 88 mg/dL
eAG (mmol/L): 4.9 mmol/L

## 2024-06-08 LAB — T4, FREE: Free T4: 1.2 ng/dL (ref 0.8–1.8)

## 2024-06-08 LAB — HIV ANTIBODY (ROUTINE TESTING W REFLEX)
HIV 1&2 Ab, 4th Generation: NONREACTIVE
HIV FINAL INTERPRETATION: NEGATIVE

## 2024-06-08 LAB — RPR: RPR Ser Ql: NONREACTIVE

## 2024-06-08 LAB — TSH: TSH: 1.44 m[IU]/L

## 2024-06-11 LAB — CYTOLOGY - PAP
Chlamydia: NEGATIVE
Comment: NEGATIVE
Comment: NEGATIVE
Comment: NORMAL
Diagnosis: NEGATIVE
Neisseria Gonorrhea: NEGATIVE
Trichomonas: NEGATIVE

## 2024-06-13 ENCOUNTER — Ambulatory Visit: Payer: Self-pay | Admitting: Family Medicine

## 2024-07-06 ENCOUNTER — Ambulatory Visit: Admitting: Obstetrics and Gynecology

## 2024-08-09 ENCOUNTER — Inpatient Hospital Stay
Admission: RE | Admit: 2024-08-09 | Discharge: 2024-08-09 | Payer: Self-pay | Attending: Internal Medicine | Admitting: Internal Medicine

## 2024-08-09 VITALS — BP 121/81 | HR 75 | Temp 98.9°F | Resp 18 | Ht 60.0 in | Wt 138.0 lb

## 2024-08-09 DIAGNOSIS — Z113 Encounter for screening for infections with a predominantly sexual mode of transmission: Secondary | ICD-10-CM | POA: Diagnosis not present

## 2024-08-09 DIAGNOSIS — N898 Other specified noninflammatory disorders of vagina: Secondary | ICD-10-CM | POA: Diagnosis present

## 2024-08-09 LAB — CERVICOVAGINAL ANCILLARY ONLY
Bacterial Vaginitis (gardnerella): POSITIVE — AB
Candida Glabrata: NEGATIVE
Candida Vaginitis: NEGATIVE
Chlamydia: NEGATIVE
Comment: NEGATIVE
Comment: NEGATIVE
Comment: NEGATIVE
Comment: NEGATIVE
Comment: NEGATIVE
Comment: NORMAL
Neisseria Gonorrhea: NEGATIVE
Trichomonas: NEGATIVE

## 2024-08-09 NOTE — ED Provider Notes (Signed)
 [facility]    CSN: [id number] Arrival date & time: 08/09/24  9157      History   Chief Complaint Chief Complaint  Patient presents with   Vaginal Discharge    Entered by patient    HPI [facility] Pranathi Winfree is a 23 y.o. female.   Patient presents with vaginal discharge and vaginal itching that started about 3 weeks ago.  Patient reports this started after she had a medical abortion via oral pill.  Therefore, she has also been having some vaginal bleeding.  She is concerned that the abortion caused disruption of her vaginal pH.  Patient states that she has not been sexually active since abortion was completed.  Denies any concern for STD.  She does have a follow-up appointment with the abortion clinic tomorrow.  She also has a scheduled follow-up with her OB/GYN and PCP.  She denies any abdominal pain or cramping.  She is not reporting any fevers.  She denies any dysuria or urinary frequency.  Patient states that she has some leftover metronidazole  so she has taken 4 pills over the past 2 days with some improvement.   Vaginal Discharge   Past Medical History:  Diagnosis Date   Asthma     Patient Active Problem List   Diagnosis Date Noted   Contraception management 06/24/2021   MDD (major depressive disorder) 08/04/2020   Migraine 08/04/2020   Migraine without aura and without status migrainosus, not intractable 11/21/2018   Tension headache 11/21/2018   Anxiety state 11/21/2018   Sleeping difficulty 11/21/2018    Past Surgical History:  Procedure Laterality Date   NO PAST SURGERIES      OB History     Gravida  0   Para  0   Term  0   Preterm  0   AB  0   Living  0      SAB  0   IAB  0   Ectopic  0   Multiple  0   Live Births  0            Home Medications    Prior to Admission medications   Medication Sig Start Date End Date Taking? Authorizing Provider  nitrofurantoin , macrocrystal-monohydrate, (MACROBID ) 100 MG  capsule Take 1 capsule (100 mg total) by mouth 2 (two) times daily. 03/27/24   Hermanns, Ashlee P, PA-C    Family History Family History  Problem Relation Age of Onset   Hyperlipidemia Mother    Healthy Father    Migraines Neg Hx    Seizures Neg Hx    Autism Neg Hx    ADD / ADHD Neg Hx    Anxiety disorder Neg Hx    Depression Neg Hx    Bipolar disorder Neg Hx    Schizophrenia Neg Hx     Social History Social History   Tobacco Use   Smoking status: Never   Smokeless tobacco: Never  Vaping Use   Vaping status: Never Used  Substance Use Topics   Alcohol use: Yes   Drug use: Yes    Frequency: 2.0 times per week    Types: Marijuana    Comment: weekly     Allergies   Patient has no known allergies.   Review of Systems Review of Systems Per HPI  Physical Exam Triage Vital Signs ED Triage Vitals  Encounter Vitals Group     BP 08/09/24 0855 121/81     Girls Systolic BP Percentile --  Girls Diastolic BP Percentile --      Boys Systolic BP Percentile --      Boys Diastolic BP Percentile --      Pulse Rate 08/09/24 0855 75     Resp 08/09/24 0855 18     Temp 08/09/24 0855 98.9 F (37.2 C)     Temp Source 08/09/24 0855 Oral     SpO2 08/09/24 0855 96 %     Weight 08/09/24 0853 138 lb (62.6 kg)     Height 08/09/24 0853 5' (1.524 m)     Head Circumference --      Peak Flow --      Pain Score 08/09/24 0853 0     Pain Loc --      Pain Education --      Exclude from Growth Chart --    No data found.  Updated Vital Signs BP 121/81 (BP Location: Right Arm)   Pulse 75   Temp 98.9 F (37.2 C) (Oral)   Resp 18   Ht 5' (1.524 m)   Wt 138 lb (62.6 kg)   LMP 04/27/2024   SpO2 96%   BMI 26.95 kg/m   Visual Acuity Right Eye Distance:   Left Eye Distance:   Bilateral Distance:    Right Eye Near:   Left Eye Near:    Bilateral Near:     Physical Exam Constitutional:      General: She is not in acute distress.    Appearance: Normal appearance. She is not  toxic-appearing or diaphoretic.     Comments: Patient is sitting upright in chair in no acute distress.  HENT:     Head: Normocephalic and atraumatic.  Eyes:     Extraocular Movements: Extraocular movements intact.     Conjunctiva/sclera: Conjunctivae normal.  Pulmonary:     Effort: Pulmonary effort is normal.  Genitourinary:    Comments: Deferred with shared decision making. Self swab performed.  Neurological:     General: No focal deficit present.     Mental Status: She is alert and oriented to person, place, and time. Mental status is at baseline.  Psychiatric:        Mood and Affect: Mood normal.        Behavior: Behavior normal.        Thought Content: Thought content normal.        Judgment: Judgment normal.      UC Treatments / Results  Labs (all labs ordered are listed, but only abnormal results are displayed) Labs Reviewed  CERVICOVAGINAL ANCILLARY ONLY    EKG   Radiology No results found.  Procedures Procedures (including critical care time)  Medications Ordered in UC Medications - No data to display  Initial Impression / Assessment and Plan / UC Course  I have reviewed the triage vital signs and the nursing notes.  Pertinent labs & imaging results that were available during my care of the patient were reviewed by me and considered in my medical decision making (see chart for details).     Cervicovaginal swab pending.I  am most suspicious of bacterial vaginosis but awaiting results prior to treatment given no confirmed exposure to STD.  Patient does have follow-up appointment  with abortion clinic tomorrow and encouraged her to keep this appointment to ensure that vaginal discharge is not a complication from recent medical abortion.  She is not exhibiting any abdominal pain, is not in any acute distress, and there are no fever so do not think there  is concern for pelvic infection.  Advised strict follow-up precautions.  Patient verbalized understanding and  was agreeable with plan. Final Clinical Impressions(s) / UC Diagnoses   Final diagnoses:  Vaginal discharge  Screening examination for venereal disease     Discharge Instructions      Vaginal swab is pending.  We will call when it results and send any appropriate treatment.  Please refrain from sexual activity until test results and treatment are complete.  Continue with your follow-up appointments.    ED Prescriptions   None    PDMP not reviewed this encounter.   Hazen Darryle BRAVO, [place] 08/09/24 1029

## 2024-08-09 NOTE — ED Triage Notes (Signed)
 Patient c/o possible BV sx's x 3 weeks.  Patient is having vaginal itching, discharge and some odor.  Patient has taken some Flagyl  that she had leftover from a previous time.

## 2024-08-09 NOTE — Discharge Instructions (Signed)
 Vaginal swab is pending.  We will call when it results and send any appropriate treatment.  Please refrain from sexual activity until test results and treatment are complete.  Continue with your follow-up appointments.

## 2024-08-10 ENCOUNTER — Ambulatory Visit: Payer: Self-pay | Admitting: Internal Medicine

## 2024-08-10 MED ORDER — METRONIDAZOLE 500 MG PO TABS: 500.0000 mg | ORAL_TABLET | Freq: Two times a day (BID) | ORAL | 0 refills | Status: DC

## 2024-08-24 ENCOUNTER — Ambulatory Visit: Admitting: Family Medicine

## 2024-08-24 ENCOUNTER — Other Ambulatory Visit (HOSPITAL_COMMUNITY)
Admission: RE | Admit: 2024-08-24 | Discharge: 2024-08-24 | Disposition: A | Discharge status: Home / Self Care | Source: Ambulatory Visit | Attending: Family Medicine | Admitting: Family Medicine

## 2024-08-24 ENCOUNTER — Encounter: Payer: Self-pay | Admitting: Family Medicine

## 2024-08-24 VITALS — BP 130/82 | HR 64 | Temp 98.8°F | Ht 60.0 in | Wt 139.6 lb

## 2024-08-24 DIAGNOSIS — Z30013 Encounter for initial prescription of injectable contraceptive: Secondary | ICD-10-CM

## 2024-08-24 DIAGNOSIS — N760 Acute vaginitis: Secondary | ICD-10-CM | POA: Insufficient documentation

## 2024-08-24 LAB — POCT URINE PREGNANCY: Preg Test, Ur: NEGATIVE

## 2024-08-24 MED ORDER — FLUCONAZOLE 150 MG PO TABS: ORAL_TABLET | ORAL | 0 refills | Status: AC

## 2024-08-24 MED ORDER — MEDROXYPROGESTERONE ACETATE 150 MG/ML IM SUSP
150.0000 mg | Freq: Once | INTRAMUSCULAR | Status: AC
  Administered 2024-08-24: 150 mg via INTRAMUSCULAR

## 2024-08-24 NOTE — Progress Notes (Signed)
 "  Established Patient Office Visit   Subjective  Patient ID: Sharon Mcpherson [name], female    DOB: 04/21/2001  Age: 23 y.o. MRN: [id number]  Chief Complaint  Patient presents with   Acute Visit    Patient is here because she wants birth control and something for a yeast infection.  Patient went to urgent care where they told her she has BV and she said they were supposed to send in something for yeast due to being prescribed antibiotics but they did not.    Pt is a 23 yo female seen for vaginal concern and contraception management.  S/p EAB 08/19/24.  Feeling ok emotionally.  Given flagyl  for BV.  Endorses vaginal d/c and pruritus.  Had nexplanon  in the past.  Does not feel like able to take OCPs consistently.      Patient Active Problem List   Diagnosis Date Noted   Contraception management 06/24/2021   MDD (major depressive disorder) 08/04/2020   Migraine 08/04/2020   Migraine without aura and without status migrainosus, not intractable 11/21/2018   Tension headache 11/21/2018   Anxiety state 11/21/2018   Sleeping difficulty 11/21/2018   Past Medical History:  Diagnosis Date   Asthma    Past Surgical History:  Procedure Laterality Date   NO PAST SURGERIES     Social History[1] Family History  Problem Relation Age of Onset   Hyperlipidemia Mother    Healthy Father    Migraines Neg Hx    Seizures Neg Hx    Autism Neg Hx    ADD / ADHD Neg Hx    Anxiety disorder Neg Hx    Depression Neg Hx    Bipolar disorder Neg Hx    Schizophrenia Neg Hx    Allergies[2]  ROS Negative unless stated above    Objective:     BP 130/82 (BP Location: Left Arm, Patient Position: Sitting, Cuff Size: Normal)   Pulse 64   Temp 98.8 F (37.1 C) (Oral)   Ht 5' (1.524 m)   Wt 139 lb 9.6 oz (63.3 kg)   LMP 04/27/2024   SpO2 98%   BMI 27.26 kg/m  BP Readings from Last 3 Encounters:  08/24/24 130/82  08/09/24 121/81  06/07/24 106/68   Wt Readings from Last 3  Encounters:  08/24/24 139 lb 9.6 oz (63.3 kg)  08/09/24 138 lb (62.6 kg)  06/07/24 136 lb 12.8 oz (62.1 kg)      Physical Exam Constitutional:      General: She is not in acute distress.    Appearance: Normal appearance.  HENT:     Head: Normocephalic and atraumatic.     Nose: Nose normal.     Mouth/Throat:     Mouth: Mucous membranes are moist.  Cardiovascular:     Rate and Rhythm: Normal rate and regular rhythm.     Heart sounds: Normal heart sounds. No murmur heard.    No gallop.  Pulmonary:     Effort: Pulmonary effort is normal. No respiratory distress.     Breath sounds: Normal breath sounds. No wheezing, rhonchi or rales.  Genitourinary:    Comments: Aptima self swab. Skin:    General: Skin is warm and dry.  Neurological:     Mental Status: She is alert and oriented to person, place, and time.        08/24/2024    1:45 PM 06/07/2024    9:53 AM 02/20/2024   11:14 AM  Depression screen PHQ 2/9  Decreased  Interest 0 1 1  Down, Depressed, Hopeless 0 1 0  PHQ - 2 Score 0 2 1  Altered sleeping 0 3 1  Tired, decreased energy 2 1 2   Change in appetite 1 3 2   Feeling bad or failure about yourself  0 0 0  Trouble concentrating 0 0 1  Moving slowly or fidgety/restless 0 0 0  Suicidal thoughts 0 0 0  PHQ-9 Score 3 9  7    Difficult doing work/chores Not difficult at all Not difficult at all Not difficult at all     Data saved with a previous flowsheet row definition      08/24/2024    1:45 PM 06/07/2024    9:53 AM 02/20/2024   11:15 AM 11/14/2023    2:23 PM  GAD 7 : Generalized Anxiety Score  Nervous, Anxious, on Edge 0 0 0 0  Control/stop worrying 0 0 0 0  Worry too much - different things 0 0 0 0  Trouble relaxing 0 0 0 0  Restless 0 0 0 0  Easily annoyed or irritable 3 3 0 2  Afraid - awful might happen 0 0 0 0  Total GAD 7 Score 3 3 0 2  Anxiety Difficulty  Not difficult at all Not difficult at all Not difficult at all     No results found for any  visits on 08/24/24.    Assessment & Plan:   Acute vaginitis -     Cervicovaginal ancillary only -     Fluconazole ; Take one tab now.  Repeat dose in 3 days if needed.  Dispense: 2 tablet; Refill: 0  Encounter for initial prescription of injectable contraceptive -     POCT urine pregnancy -     medroxyPROGESTERone  Acetate  Acute vaginitis, s/p EAB.  Completed course of flagyl .  Aptima self swab obtained.  Start diflucan .  Further recs based on results.  POC hCG neg.  Start depo-provera .  Additional contraception method advised..  F/u in 3 months for repeat depo injection.  Return if symptoms worsen or fail to improve.   Clotilda JONELLE Single, MD      [1]  Social History Tobacco Use   Smoking status: Never   Smokeless tobacco: Never  Vaping Use   Vaping status: Never Used  Substance Use Topics   Alcohol use: Yes   Drug use: Yes    Frequency: 2.0 times per week    Types: Marijuana    Comment: weekly  [2] No Known Allergies  "

## 2024-08-27 LAB — CERVICOVAGINAL ANCILLARY ONLY
Bacterial Vaginitis (gardnerella): POSITIVE — AB
Candida Glabrata: NEGATIVE
Candida Vaginitis: POSITIVE — AB
Chlamydia: NEGATIVE
Comment: NEGATIVE
Comment: NEGATIVE
Comment: NEGATIVE
Comment: NEGATIVE
Comment: NEGATIVE
Comment: NORMAL
Neisseria Gonorrhea: NEGATIVE
Trichomonas: NEGATIVE

## 2024-09-11 ENCOUNTER — Telehealth: Payer: Self-pay | Admitting: Family Medicine

## 2024-09-11 ENCOUNTER — Other Ambulatory Visit: Payer: Self-pay | Admitting: Family Medicine

## 2024-09-11 DIAGNOSIS — B9689 Other specified bacterial agents as the cause of diseases classified elsewhere: Secondary | ICD-10-CM

## 2024-09-11 MED ORDER — METRONIDAZOLE 500 MG PO TABS: 500.0000 mg | ORAL_TABLET | Freq: Two times a day (BID) | ORAL | 0 refills | Status: AC

## 2024-09-11 NOTE — Telephone Encounter (Signed)
 Called and left Vm per DPR, patient is aware

## 2024-09-11 NOTE — Telephone Encounter (Signed)
 I sent Rx for Metronidazole  to treat for possible BV and based on recent cervico-vaginal cytology result and as requested; but reviewing records, she was prescribed Metronidazole  500 mg bid x 7 days in 08/10/2024. No alcohol while on medication. So she needs to follow up with PCP or her gyn if symptoms are persistent. Thanks, BJ

## 2024-09-11 NOTE — Addendum Note (Signed)
 Addended by: Desma Wilkowski G on: 09/11/2024 04:13 PM   Modules accepted: Orders

## 2024-09-11 NOTE — Telephone Encounter (Signed)
 Copied from CRM [id number]. Topic: General - Other >> Sep 11, 2024 12:50 PM Alfonso HERO wrote: Reason for CRM: patient says metroNIDAZOLE  (FLAGYL ) 500 MG tablet wasn't sent to the pharmacy after her last appt. Calling to see if it can be sent in or if she needs to be seen again.

## 2024-09-12 ENCOUNTER — Ambulatory Visit: Payer: Self-pay | Admitting: Family Medicine

## 2024-09-13 ENCOUNTER — Ambulatory Visit
Admission: RE | Admit: 2024-09-13 | Discharge: 2024-09-13 | Disposition: A | Discharge status: Home / Self Care | Payer: Self-pay | Source: Ambulatory Visit | Attending: Family Medicine

## 2024-09-13 VITALS — BP 138/91 | HR 65 | Temp 98.9°F | Resp 18

## 2024-09-13 DIAGNOSIS — R350 Frequency of micturition: Secondary | ICD-10-CM

## 2024-09-13 DIAGNOSIS — Z113 Encounter for screening for infections with a predominantly sexual mode of transmission: Secondary | ICD-10-CM | POA: Diagnosis present

## 2024-09-13 LAB — POCT URINE DIPSTICK
Bilirubin, UA: NEGATIVE
Glucose, UA: NEGATIVE mg/dL
Ketones, POC UA: NEGATIVE mg/dL
Leukocytes, UA: NEGATIVE
Nitrite, UA: NEGATIVE
Protein Ur, POC: NEGATIVE mg/dL
Spec Grav, UA: 1.02
Urobilinogen, UA: 1 U/dL
pH, UA: 7

## 2024-09-13 LAB — POCT URINE PREGNANCY: Preg Test, Ur: NEGATIVE

## 2024-09-13 NOTE — ED Triage Notes (Signed)
 Patient presents to [facility] for urinary freq x 1 week. Reports had unprotected sex so wants to rule out STDs. No OTC treatment for urinary freq symptom.

## 2024-09-13 NOTE — ED Provider Notes (Signed)
 " [facility]  Note:  This document was prepared using Conservation officer, historic buildings and may include unintentional dictation errors.  MRN: [id number] DOB: Jun 28, 2001  Subjective:   Sharon Mcpherson is a 24 y.o. female presenting for 1 week history of persistent frequency.  Denies fever, n/v, abdominal pain, pelvic pain, rashes, dysuria, hematuria, vaginal discharge.  Had unprotected sex recently and would like STI check. Drinks ~3 bottles of water daily.   Current Outpatient Medications  Medication Instructions   fluconazole  (DIFLUCAN ) 150 MG tablet Take one tab now.  Repeat dose in 3 days if needed.   metroNIDAZOLE  (FLAGYL ) 500 mg, Oral, 2 times daily   nitrofurantoin  (macrocrystal-monohydrate) (MACROBID ) 100 mg, Oral, 2 times daily    Allergies[1]  Past Medical History:  Diagnosis Date   Asthma      Past Surgical History:  Procedure Laterality Date   NO PAST SURGERIES      Family History  Problem Relation Age of Onset   Hyperlipidemia Mother    Healthy Father    Migraines Neg Hx    Seizures Neg Hx    Autism Neg Hx    ADD / ADHD Neg Hx    Anxiety disorder Neg Hx    Depression Neg Hx    Bipolar disorder Neg Hx    Schizophrenia Neg Hx     Social History   Occupational History   Not on file  Tobacco Use   Smoking status: Never   Smokeless tobacco: Never  Vaping Use   Vaping status: Never Used  Substance and Sexual Activity   Alcohol use: Yes   Drug use: Yes    Frequency: 2.0 times per week    Types: Marijuana    Comment: weekly   Sexual activity: Yes    Partners: Male    Birth control/protection: None     ROS   Objective:   Vitals: BP (!) 138/91 (BP Location: Right Arm)   Pulse 65   Temp 98.9 F (37.2 C) (Oral)   Resp 18   LMP 08/29/2024 (Approximate)   SpO2 97%   Physical Exam Constitutional:      General: She is not in acute distress.    Appearance: Normal appearance. She is well-developed. She  is not ill-appearing, toxic-appearing or diaphoretic.  HENT:     Head: Normocephalic and atraumatic.     Nose: Nose normal.     Mouth/Throat:     Mouth: Mucous membranes are moist.  Eyes:     General: No scleral icterus.       Right eye: No discharge.        Left eye: No discharge.     Extraocular Movements: Extraocular movements intact.     Conjunctiva/sclera: Conjunctivae normal.  Cardiovascular:     Rate and Rhythm: Normal rate.  Pulmonary:     Effort: Pulmonary effort is normal.  Abdominal:     General: Bowel sounds are normal. There is no distension.     Palpations: Abdomen is soft. There is no mass.     Tenderness: There is no abdominal tenderness. There is no right CVA tenderness, left CVA tenderness, guarding or rebound.  Skin:    General: Skin is warm and dry.  Neurological:     General: No focal deficit present.     Mental Status: She is alert and oriented to person, place, and time.  Psychiatric:        Mood and Affect: Mood normal.  Behavior: Behavior normal.        Thought Content: Thought content normal.        Judgment: Judgment normal.     Results for orders placed or performed during the hospital encounter of 09/13/24 (from the past 24 hours)  POCT URINE DIPSTICK     Status: Abnormal   Collection Time: 09/13/24  8:39 AM  Result Value Ref Range   Color, UA yellow yellow   Clarity, UA clear clear   Glucose, UA negative negative mg/dL   Bilirubin, UA negative negative   Ketones, POC UA negative negative mg/dL   Spec Grav, UA 8.979 8.989 - 1.025   Blood, UA trace-lysed (A) negative   pH, UA 7.0 5.0 - 8.0   Protein Ur, POC negative negative mg/dL   Urobilinogen, UA 1.0 0.2 or 1.0 E.U./dL   Nitrite, UA Negative Negative   Leukocytes, UA Negative Negative  POCT urine pregnancy     Status: Normal   Collection Time: 09/13/24  8:40 AM  Result Value Ref Range   Preg Test, Ur Negative Negative    Assessment and Plan :   PDMP not reviewed this  encounter.  1. Urinary frequency   2. Screening examination for STI    Urine culture and STI check pending.  Hydrate more consistently, avoid urinary irritants.  Will update treatment based off of results.    [1] No Known Allergies    Christopher Savannah, [place] 09/13/24 [id number]  "

## 2024-09-13 NOTE — Discharge Instructions (Addendum)
 Make sure you hydrate very well with plain water and a quantity of 64 ounces of water a day.  Please limit drinks that are considered urinary irritants such as fruit juices, soda, sweet tea, coffee, artifical sweetened drinks, energy drinks, alcohol.  These can worsen your urinary and genital symptoms but also be the source of them.  I will let you know about your urine culture and sexually transmitted infection test results through MyChart to see if we need to prescribe or change your antibiotics based off of those results.

## 2024-09-14 ENCOUNTER — Ambulatory Visit (HOSPITAL_COMMUNITY): Payer: Self-pay

## 2024-09-14 LAB — CERVICOVAGINAL ANCILLARY ONLY
Chlamydia: NEGATIVE
Comment: NEGATIVE
Comment: NEGATIVE
Comment: NORMAL
Neisseria Gonorrhea: NEGATIVE
Trichomonas: NEGATIVE

## 2024-09-14 LAB — URINE CULTURE: Culture: NO GROWTH

## 2024-10-04 DIAGNOSIS — Z87440 Personal history of urinary (tract) infections: Secondary | ICD-10-CM | POA: Insufficient documentation

## 2024-10-04 DIAGNOSIS — R351 Nocturia: Secondary | ICD-10-CM | POA: Insufficient documentation

## 2024-10-05 ENCOUNTER — Encounter: Payer: Self-pay | Admitting: Obstetrics

## 2024-10-05 ENCOUNTER — Other Ambulatory Visit (HOSPITAL_COMMUNITY)
Admission: RE | Admit: 2024-10-05 | Discharge: 2024-10-05 | Disposition: A | Source: Other Acute Inpatient Hospital | Attending: Obstetrics | Admitting: Obstetrics

## 2024-10-05 ENCOUNTER — Ambulatory Visit: Admitting: Obstetrics

## 2024-10-05 VITALS — BP 138/94 | HR 81 | Ht 61.42 in | Wt 138.0 lb

## 2024-10-05 DIAGNOSIS — R351 Nocturia: Secondary | ICD-10-CM

## 2024-10-05 DIAGNOSIS — G8929 Other chronic pain: Secondary | ICD-10-CM

## 2024-10-05 DIAGNOSIS — Z87440 Personal history of urinary (tract) infections: Secondary | ICD-10-CM

## 2024-10-05 DIAGNOSIS — M545 Low back pain, unspecified: Secondary | ICD-10-CM | POA: Insufficient documentation

## 2024-10-05 DIAGNOSIS — R8290 Unspecified abnormal findings in urine: Secondary | ICD-10-CM | POA: Insufficient documentation

## 2024-10-05 LAB — POCT URINALYSIS DIP (CLINITEK)
Bilirubin, UA: NEGATIVE
Bilirubin, UA: NEGATIVE
Glucose, UA: NEGATIVE mg/dL
Glucose, UA: NEGATIVE mg/dL
Ketones, POC UA: NEGATIVE mg/dL
Ketones, POC UA: NEGATIVE mg/dL
Leukocytes, UA: NEGATIVE
Leukocytes, UA: NEGATIVE
Nitrite, UA: NEGATIVE
Nitrite, UA: NEGATIVE
POC PROTEIN,UA: NEGATIVE
POC PROTEIN,UA: NEGATIVE
Spec Grav, UA: 1.02
Spec Grav, UA: 1.02
Urobilinogen, UA: 0.2 U/dL — AB
Urobilinogen, UA: 0.2 U/dL
pH, UA: 6.5
pH, UA: 6.5

## 2024-10-05 LAB — URINALYSIS, COMPLETE (UACMP) WITH MICROSCOPIC
Bilirubin Urine: NEGATIVE
Glucose, UA: NEGATIVE mg/dL
Ketones, ur: NEGATIVE mg/dL
Leukocytes,Ua: NEGATIVE
Nitrite: NEGATIVE
Protein, ur: NEGATIVE mg/dL
Specific Gravity, Urine: 1.016 (ref 1.005–1.030)
pH: 6 (ref 5.0–8.0)

## 2024-10-05 MED ORDER — PHENAZOPYRIDINE HCL 200 MG PO TABS: 200.0000 mg | ORAL_TABLET | Freq: Three times a day (TID) | ORAL | 0 refills | Status: AC | PRN

## 2024-10-05 NOTE — Assessment & Plan Note (Signed)
-   reproducible pain with palpation of SI joints bilaterally - encouraged sleeping with a pillow between her legs on her side - encouraged Voltaren gel up to 2g 4x/day and heating pad as needed for comfort

## 2024-10-05 NOTE — Assessment & Plan Note (Signed)
-   POCT clean catch UA + heme, pending catheterized UA microscopy and culture

## 2024-10-05 NOTE — Patient Instructions (Addendum)
 For treatment of recurrent urinary tract infections, we discussed management of recurrent UTIs including prophylaxis with a daily low dose antibiotic, transvaginal estrogen therapy, D-mannose, and cranberry supplements.  We discussed the role of diagnostic testing such as cystoscopy and upper tract imaging.     Start Ibuprofen and Tylenol with Pyridium  as needed when you experience signs and symptoms of UTI.  Please present to the office for testing if your symptoms persistent or worsens.  For night time frequency: - avoid fluid intake 3 hours before bedtime  Today we talked about ways to manage bladder urgency such as altering your diet to avoid irritative beverages and foods (bladder diet) as well as attempting to decrease stress and other exacerbating factors.  You can also chew a plain Tums 1-3 times per day to make your urine less acidic, especially if you have eating/drinking acidic things.   There is a website with helpful information for people with bladder irritation, called the IC Network at https://www.ic-network.com. This website has more information about a healthy bladder diet and patient forums for support.  The Most Bothersome Foods* The Least Bothersome Foods*  Coffee - Regular & Decaf Tea - caffeinated Carbonated beverages - cola, non-colas, diet & caffeine-free Alcohols - Beer, Red Wine, White Wine, [address] - Grapefruit, [place], Orange, [employer] - Cranberry, Grapefruit, Orange, Pineapple Vegetables - Tomato & Tomato Products Flavor Enhancers - Hot peppers, Spicy foods, Chili, Horseradish, Vinegar, Monosodium glutamate (MSG) Artificial Sweeteners - NutraSweet, Sweet 'N Low, Equal (sweetener), Saccharin Ethnic foods - Mexican, Thai, Indian food [employer] - low-fat & whole Fruits - Bananas, Blueberries, Honeydew melon, Pears, Raisins, Watermelon Vegetables - Broccoli, [address] Sprouts, [place], Carrots, Cauliflower, [place], Cucumber, Mushrooms, Peas,  Radishes, Squash, Zucchini, White potatoes, Sweet potatoes & yams Poultry - Chicken, Eggs, Turkey, [employer] - Beef, [employer], Lamb Seafood - Shrimp, [place] fish, Salmon Grains - Oat, Rice Snacks - Pretzels, Popcorn  *Mitch ALF et al. Diet and its role in interstitial cystitis/bladder pain syndrome (IC/BPS) and comorbid conditions. [employer]. [employer]. 2012 Jan 11.

## 2024-10-05 NOTE — Assessment & Plan Note (Signed)
-   avoid fluid intake 3 hours before bedtime

## 2024-10-05 NOTE — Assessment & Plan Note (Addendum)
-   does not fit criteria for recurrent UTI, prior UTI symptoms with negative urine culture - 03/25/24 urine culture >10K staphylococcus epidermidis resistant to Cipro, Oxacillin, Tetracycline, and bactrim . S/p macrobid  - For treatment of urinary tract infections, we discussed management of recurrent UTIs including prophylaxis with a daily low dose antibiotic, transvaginal estrogen therapy, D-mannose, and cranberry supplements.  We discussed the role of diagnostic testing such as cystoscopy and upper tract imaging if symptoms recur.  - encouraged to start probiotics - Rx pyridium  PRN UTI symptoms. 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  We also discussed using pyridium  and similar over-the-counter medications for pain relief as needed. We discussed the pentad of medications including Tums, an antihistamine such as Vistaril , amitriptyline , and L-arginine.  We also discussed in-office bladder instillations for pain flares, as well as cystoscopy with hydrodistention in the operating room, which can be both diagnostic and therapeutic. She was also given information on the IC Network at https://www.ic-network.com for bladder diet suggestions and patient forums for support. - encouraged to monitor for dietary or activity triggers due to dairy sensitivity

## 2024-10-06 LAB — URINE CULTURE: Culture: NO GROWTH

## 2024-10-08 ENCOUNTER — Ambulatory Visit: Payer: Self-pay | Admitting: Obstetrics
